# Patient Record
Sex: Male | Born: 1935 | Race: White | Hispanic: No | Smoking: Never smoker
Health system: Southern US, Community
[De-identification: ages and names within clinical notes are randomized; demographics above are authoritative.]

## PROBLEM LIST (undated history)

## (undated) DIAGNOSIS — C719 Malignant neoplasm of brain, unspecified: Secondary | ICD-10-CM

## (undated) DIAGNOSIS — M159 Polyosteoarthritis, unspecified: Secondary | ICD-10-CM

## (undated) HISTORY — PX: BACK SURGERY: SHX140

## (undated) HISTORY — DX: Malignant neoplasm of brain, unspecified: C71.9

## (undated) HISTORY — PX: JOINT REPLACEMENT: SHX530

## (undated) HISTORY — DX: Polyosteoarthritis, unspecified: M15.9

## (undated) HISTORY — PX: CARDIAC CATHETERIZATION: SHX172

---

## 1998-02-28 ENCOUNTER — Encounter: Payer: Self-pay | Admitting: Orthopedic Surgery

## 1998-03-07 ENCOUNTER — Inpatient Hospital Stay (HOSPITAL_COMMUNITY): Admission: RE | Admit: 1998-03-07 | Discharge: 1998-03-12 | Payer: Self-pay | Admitting: Orthopedic Surgery

## 1999-04-01 ENCOUNTER — Ambulatory Visit (HOSPITAL_COMMUNITY): Admission: RE | Admit: 1999-04-01 | Discharge: 1999-04-01 | Payer: Self-pay | Admitting: Cardiology

## 1999-04-08 ENCOUNTER — Ambulatory Visit (HOSPITAL_COMMUNITY): Admission: RE | Admit: 1999-04-08 | Discharge: 1999-04-08 | Payer: Self-pay | Admitting: Cardiology

## 1999-06-16 ENCOUNTER — Ambulatory Visit (HOSPITAL_COMMUNITY): Admission: RE | Admit: 1999-06-16 | Discharge: 1999-06-16 | Payer: Self-pay | Admitting: Cardiology

## 2002-04-25 ENCOUNTER — Ambulatory Visit (HOSPITAL_BASED_OUTPATIENT_CLINIC_OR_DEPARTMENT_OTHER): Admission: RE | Admit: 2002-04-25 | Discharge: 2002-04-25 | Payer: Self-pay | Admitting: Orthopedic Surgery

## 2004-01-10 ENCOUNTER — Encounter: Payer: Self-pay | Admitting: Emergency Medicine

## 2004-01-28 ENCOUNTER — Inpatient Hospital Stay (HOSPITAL_COMMUNITY): Admission: RE | Admit: 2004-01-28 | Discharge: 2004-02-03 | Payer: Self-pay | Admitting: Orthopedic Surgery

## 2004-04-24 ENCOUNTER — Encounter: Admission: RE | Admit: 2004-04-24 | Discharge: 2004-04-24 | Payer: Self-pay | Admitting: Orthopedic Surgery

## 2004-07-24 ENCOUNTER — Encounter: Admission: RE | Admit: 2004-07-24 | Discharge: 2004-07-24 | Payer: Self-pay | Admitting: Internal Medicine

## 2004-12-25 ENCOUNTER — Ambulatory Visit: Payer: Self-pay | Admitting: Internal Medicine

## 2005-01-13 ENCOUNTER — Ambulatory Visit: Payer: Self-pay | Admitting: Internal Medicine

## 2005-01-13 ENCOUNTER — Encounter (INDEPENDENT_AMBULATORY_CARE_PROVIDER_SITE_OTHER): Payer: Self-pay | Admitting: Specialist

## 2006-12-06 ENCOUNTER — Emergency Department: Payer: Self-pay | Admitting: Emergency Medicine

## 2008-11-06 ENCOUNTER — Inpatient Hospital Stay (HOSPITAL_COMMUNITY): Admission: RE | Admit: 2008-11-06 | Discharge: 2008-11-14 | Payer: Self-pay | Admitting: Neurological Surgery

## 2008-11-08 ENCOUNTER — Ambulatory Visit: Payer: Self-pay | Admitting: Physical Medicine & Rehabilitation

## 2008-12-06 ENCOUNTER — Ambulatory Visit (HOSPITAL_COMMUNITY): Admission: RE | Admit: 2008-12-06 | Discharge: 2008-12-06 | Payer: Self-pay | Admitting: Neurological Surgery

## 2008-12-06 ENCOUNTER — Ambulatory Visit: Payer: Self-pay | Admitting: Surgery

## 2008-12-06 ENCOUNTER — Encounter (INDEPENDENT_AMBULATORY_CARE_PROVIDER_SITE_OTHER): Payer: Self-pay | Admitting: Neurological Surgery

## 2009-12-11 ENCOUNTER — Encounter (INDEPENDENT_AMBULATORY_CARE_PROVIDER_SITE_OTHER): Payer: Self-pay | Admitting: *Deleted

## 2010-07-08 NOTE — Letter (Signed)
Summary: Colonoscopy Date Change Letter  [facility]  [address] [place], [place] [address]   Phone: [phone]  Fax: [phone]      December 11, 2009 MRN: [id number]   Mark Kerr [address] [place], [place]  [address]   Dear Mark Kerr,   Previously you were recommended to have a repeat colonoscopy around this time. Your chart was recently reviewed by Dr. Hedwig Morton. Juanda Chance of  Gastroenterology. Follow up colonoscopy is now recommended in August 2013. This revised recommendation is based on current, nationally recognized guidelines for colorectal cancer screening and polyp surveillance. These guidelines are endorsed by the American Cancer Society, [employer] on Colorectal Cancer as well as numerous other major medical organizations.  Please understand that our recommendation assumes that you do not have any new symptoms such as bleeding, a change in bowel habits, anemia, or significant abdominal discomfort. If you do have any concerning GI symptoms or want to discuss the guideline recommendations, please call to arrange an office visit at your earliest convenience. Otherwise we will keep you in our reminder system and contact you 1-2 months prior to the date listed above to schedule your next colonoscopy.  Thank you,  Hedwig Morton. Juanda Chance, M.D.  [facility] [phone]

## 2010-09-15 LAB — BASIC METABOLIC PANEL
BUN: 10 mg/dL (ref 6–23)
CO2: 28 mEq/L (ref 19–32)
Calcium: 7.5 mg/dL — ABNORMAL LOW (ref 8.4–10.5)
Chloride: 108 mEq/L (ref 96–112)
Creatinine, Ser: 0.74 mg/dL (ref 0.4–1.5)
GFR calc Af Amer: >60 mL/min (ref >60)
GFR calc non Af Amer: >60 mL/min (ref >60)
Glucose, Bld: 114 mg/dL — ABNORMAL HIGH (ref 70–99)
Potassium: 3.6 mEq/L (ref 3.5–5.1)
Potassium: 4 mEq/L (ref 3.5–5.1)
Sodium: 140 mEq/L (ref 135–145)

## 2010-09-15 LAB — PREPARE FRESH FROZEN PLASMA

## 2010-09-15 LAB — CBC
HCT: 26.8 % — ABNORMAL LOW (ref 39.0–52.0)
HCT: 29.1 % — ABNORMAL LOW (ref 39.0–52.0)
MCHC: 34.9 g/dL (ref 30.0–36.0)
MCV: 95.2 fL (ref 78.0–100.0)
Platelets: 113 10*3/uL — ABNORMAL LOW (ref 150–400)
RBC: 2.81 MIL/uL — ABNORMAL LOW (ref 4.22–5.81)
RBC: 3.01 MIL/uL — ABNORMAL LOW (ref 4.22–5.81)
RBC: 3.1 MIL/uL — ABNORMAL LOW (ref 4.22–5.81)
RDW: 13.1 % (ref 11.5–15.5)
RDW: 13.2 % (ref 11.5–15.5)
WBC: 8.8 10*3/uL (ref 4.0–10.5)

## 2010-09-16 LAB — CBC
HCT: 41.2 % (ref 39.0–52.0)
MCV: 93.9 fL (ref 78.0–100.0)
RBC: 4.38 MIL/uL (ref 4.22–5.81)
RDW: 13.4 % (ref 11.5–15.5)
WBC: 6.3 10*3/uL (ref 4.0–10.5)

## 2010-09-16 LAB — BASIC METABOLIC PANEL
CO2: 30 mEq/L (ref 19–32)
Calcium: 9.4 mg/dL (ref 8.4–10.5)
GFR calc non Af Amer: >60 mL/min (ref >60)
Potassium: 4.3 mEq/L (ref 3.5–5.1)
Sodium: 141 mEq/L (ref 135–145)

## 2010-09-16 LAB — TYPE AND SCREEN
ABO/RH(D): O POS
Antibody Screen: NEGATIVE

## 2010-09-16 LAB — ABO/RH: ABO/RH(D): O POS

## 2010-10-21 NOTE — Op Note (Signed)
NAMESHLOIME, KEILMAN                 ACCOUNT NO.:  [id number]   MEDICAL RECORD NO.:  [id number]          PATIENT TYPE:  INP   LOCATION:  3110                         FACILITY:  [facility]   PHYSICIAN:  Stefani Dama, M.D.  DATE OF BIRTH:  1930/07/16   DATE OF PROCEDURE:  11/06/2008  DATE OF DISCHARGE:                               OPERATIVE REPORT   PREOPERATIVE DIAGNOSES:  Spondylolisthesis L4-L5 with stenosis L4-L5 and  right lumbar radiculopathy.   POSTOPERATIVE DIAGNOSES:  Spondylolisthesis L4-L5 with stenosis L4-L5  and right lumbar radiculopathy.   PROCEDURES:  Laminectomy L4, posterior lumbar interbody decompression L4-  5, posterior lumbar interbody arthrodesis with PEEK spacers L4-L5,  pedicle screw fixation L4-L5, posterolateral arthrodesis with allograft  and autograft.   SURGEON:  Stefani Dama, MD   FIRST ASSISTANT:  Clydene Fake, MD   ANESTHESIA:  General endotracheal.   INDICATIONS:  [name] is a 77 year old individual who has had  significant back and bilateral lower extremity pain, much worse on the  right side with some weakness in his right leg.  He has had  spondylolisthesis in addition to spondylitic changes elsewhere in the  spine.  He has a high-grade stenosis at the level of L4-L5.  He was seen  last in September 2009 and October 2009 with an MRI that demonstrated  high-grade stenosis and weakness in the right lower extremity, but he  was getting by and he was advised regarding surgical versus surgical  treatment.  He was now taken to the operating room to undergo surgical  decompression and stabilization of L4-L5.   PROCEDURE:  The patient was brought to the operating room, placed on the  table in prone position.  The back was prepped with alcohol and DuraPrep  and draped in a sterile fashion.  Midline incision was created and  carried down to the lumbodorsal fascia.  Fascia was opened on either  side of midline to expose the interlaminar  spaces.  L4 and L5 were  positively identified.  The dissection was carried out over the facet  joints and there was noted to be particularly severe hypertrophy of the  L4-L5 facet joint and laminotomies were created removing the inferior  margin lamina and including the entirety of the inferior facet of L4.  The common dural tube was then exposed and the dissection was carried  out carefully to expose the common dural tube up above the L4 nerve root  superiorly.  The foramen of the L4 nerve root particularly on the right  side was noted to be severely stenotic.  This was carefully dissected  with a 2-mm Kerrison punch being used to dissect the L5 nerve root  inferiorly with severe lateral recess stenosis noted for that nerve root  in the fourth nerve roots superiorly which was being abutted by the  superior articular process of L5 vertebra in the region of the foramen.  Once this was decompressed and hemostasis was achieved, the disk space  was identified and it was entered with a 15 blade combination of  curettes and rongeurs and spreaders  were used to dissect the disk space  and perform as complete diskectomy.  Once the dilator could be placed in  the space, the opposite side that is the left side was decompressed with  care being taken to protect the L4 nerve root and do a thorough  decompression of the L4 nerve root into the extraforaminal space.  L5  nerve root was similarly decompressed from severe overgrown laminar and  lateral recess stenosis.  Once these areas were secured, diskectomy was  performed on the left side.  Complete diskectomy was performed so that  ultimately a 10-mm PEEK spacer could be placed into the interspace.  Once the endplates were appropriately decorticated, combination of  autograft with some infuse strips was placed into the disk place and  autograft bearing PEEK spacers were packed into the interspaces and the  retractor was withdrawn.  Attention was then  turned to pedicle screw  placement using fluoroscopic guidance.  On the left side a 6.5 x 45 mm  pedicle screw was placed into L5 and on the right side a 6.5 x 40 mm  screw was placed at L5.  At L4, two 6.5 x 40 mm screws were placed being  careful to make sure that there was no lateral cutout.  Once the screws  were placed, a 30-mm rod was used to pack the two screws.  It was noted  that there was on a postoperative radiograph partial reduction of the  severe grade 1 to 2 spondylolisthesis that was part of the stenosis.  Once the hardware was placed, it was locked into position with a torque  wrench.  Lateral gutters which had previously been packed off were  decorticated, then laid with autograft in addition to some chronOS  strips.  These were packed tightly in the lateral gutters.  Once the  retractors were removed, hemostasis was achieved from the soft tissues  and the muscular tissues.  Lumbodorsal fascia was then reapproximated  with #1 Vicryl in the fascia and 2-0 Vicryl was used in the subcutaneous  tissues and 3-0 Vicryl subcuticularly.  Steri-Strips were placed on the  skin.  The patient tolerated the procedure well and was returned to  recovery in stable condition.      Stefani Dama, M.D.  Electronically Signed     HJE/MEDQ  D:  11/06/2008  T:  11/07/2008  Job:  [id number]

## 2010-10-21 NOTE — Discharge Summary (Signed)
Mark Kerr, Mark Kerr                 ACCOUNT NO.:  [id number]   MEDICAL RECORD NO.:  [id number]          PATIENT TYPE:  INP   LOCATION:  5037                         FACILITY:  [facility]   PHYSICIAN:  Stefani Dama, M.D.  DATE OF BIRTH:  1930/12/21   DATE OF ADMISSION:  11/06/2008  DATE OF DISCHARGE:  11/14/2008                               DISCHARGE SUMMARY   ADMITTING DIAGNOSIS:  Spondylolisthesis L4-5 with spinal stenosis L4-5  and right lower extremity lumbar radiculopathy.   DISCHARGE DIAGNOSIS:  Spondylolisthesis L4-5 with spinal stenosis L4-5  and right lower extremity lumbar radiculopathy.   OPERATIONS AND PROCEDURES:  Laminectomy L4, posterior lumbar interbody  decompression L4-5, posterior lumbar interbody arthrodesis with peak  spacers L4-5, pedicle screw fixation L4-5, posterolateral arthrodesis  with allograft and autograft by Dr. Danielle Dess.   ASSISTANT:  Dr. Phoebe Perch.   BRIEF HISTORY AND HOSPITAL COURSE:  Mr. Minion is a 77 year old who has  had significant back and bilateral lower extremity pain worse on the  right side with right leg weakness.  He has had a spondylolisthesis in  addition to spondylosis changes, high-grade stenosis at the level of L4-  L5.  Seen in September 2009 and October 2009.  MRI demonstrated high-  grade stenosis, weakness regular extremity.  He did not respond to  conservative measures, and he ha snow elected to proceed with surgical  intervention to decompress and stabilize the L4-5 segment.  On November 06, 2008, the patient underwent surgical intervention, posterior spinal  fusion L4-5 with interbody fusion spacer.  He tolerated the procedure  well, stabilized in recovery room, placed in the neurosurgical ICU.  Postoperatively, he had some hypotension which was asymptomatic, treated  with IV fluid boluses, and he did receive a unit of Plasmanate  postoperatively.  He had hemoglobin of 9.1, hematocrit of 26.8.  Prior  to that, it was 9.8, hematocrit  28.6, and postoperatively immediately  was 10.2 and hematocrit 29.1 and preoperatively 14.4 with hematocrit of  41.2.  After his blood pressure stabilized and after he received  Plasmanate, his blood pressure became stable.  He started working with  physical therapy and occupational therapy.  He was making slow progress.  He was transferred out of the neurosurgical ICU to a regular hospital  bed, was weaned off his PCA pump to p.o. pain medications, continued to  make slow progress.  Due to his age, difficulty with ambulation and the  fact that his wife was also hospitalized at the same time, and he did  not have anybody at home for providing care, it was felt that he would  benefit from a temporary skilled nursing facility stay.  He continued to  work with physical therapy and occupational therapy during his  hospitalization.  He was eating well, voiding well at the time of  discharge, and he was ready for discharge on Wednesday, November 14, 2008.   DISCHARGE CONDITION:  Stable, improved.   DISCHARGE INSTRUCTIONS:  Discharge to skilled nursing facility to follow  up with Dr. Danielle Dess in 3 weeks for radiographs and clinical check.  Prescription  for Percocet 1 to 2 p.o. q.4-6 h. p.r.n. pain, Valium 5 mg  1 p.o. q.8-12 h. p.r.n. muscle spasm.  Continue with physical therapy,  occupational therapy on progressive ambulation and safe ambulation.  He  will need stair training because he has 13 steps at home to mobilize  about his own home, and he will need generalized conditioning and  strengthening.  He will continue with back precautions.  He is wearing  an Aspen quick draw brace which he should continue.  He should also  continue on his home medications which include Senokot, Lovaza,  multivitamins, Neurontin 300 mg t.i.d., Colace 100 mg b.i.d., Voltaren  75 mg b.i.d. which should  take sparingly due to his recent spinal fusion and vitamin D 2000 units  p.o. daily.  Zofran is p.r.n. nausea 2 to  4 mg p.o. q.4-6 h. p.r.n.  nausea.  Should continue on his home medications and contact our office  prior to follow up if there are any questions or concerns.  Discharged  to skilled nursing facility via ambulance November 14, 2008.      Aura Fey Bobbe Medico.      Stefani Dama, M.D.  Electronically Signed    SCI/MEDQ  D:  11/14/2008  T:  11/14/2008  Job:  [id number]

## 2010-10-24 NOTE — Consult Note (Signed)
NAME:  Mark Kerr, Mark Kerr                           ACCOUNT NO.:  [id number]   MEDICAL RECORD NO.:  [id number]                   PATIENT TYPE:  INP   LOCATION:  0483                                 FACILITY:  [facility]   PHYSICIAN:  Kari Baars, M.D.               DATE OF BIRTH:  02-11-1931   DATE OF CONSULTATION:  01/30/2004  DATE OF DISCHARGE:                                   CONSULTATION   REFERRING PHYSICIAN:  Marlowe Kays, M.D.   PRIMARY CARE PHYSICIAN:  Mark A. Perini, M.D.   REASON FOR CONSULTATION:  Postoperative fever.   HISTORY OF PRESENT ILLNESS:  Mark Kerr is a 77 year old white male with a  history of osteoarthritis who was admitted on January 28, 2004, for elective  right total knee replacement by Dr. Simonne Come.  This procedure was  uncomplicated until postoperative day #1, when he developed a low-grade  temperature of 101.1.  This morning, he had a temperature of 102.3.  We have  been asked to see him for evaluation of his postoperative fever.  The  patient does report mild shortness of breath which is improved after  nebulizer administration this morning.  He has had minimal cough without  purulent sputum production.  He did ambulate this morning and did not have  any significant dyspnea on exertion.  He does have a Foley catheter in  place.  He denies any other focal symptoms, including abdominal pain,  diarrhea, or skin rash.  Of note, he has had significant oozing from his  incision with a decrease in his hemoglobin from 12 to 8.9.  This has not  required transfusion.  He is relatively asymptomatic with regards to this.   REVIEW OF SYSTEMS:  All systems are extensively reviewed and are negative  except as in the HPI.  His pain is controlled on PCA.   PAST MEDICAL HISTORY:  1. Non-obstructive coronary artery disease with a negative Cardiolite last     week.  He had a cardiac catheterization in 2000.  2. Osteoarthritis, status post left total knee replacement  (1999), and right     total knee arthroplasty (January 28, 2004).  3. Status post right Achilles repair (2003).  4. Status post herniorrhaphy (1998).  5. Status post tonsillectomy (1941).   CURRENT MEDICATIONS:  1. Ancef 1 g q.8h. (day #3 of #3).  2. Colace daily.  3. Lovenox 30 mg b.i.d.  4. Coumadin 7.5 mg daily per protocol.  5. Robaxin 500 mg q.8h.  6. Trinsicon q.8h.  7. Albuterol p.r.n.  8. Morphine PCA.   ALLERGIES:  No known drug allergies.   FAMILY HISTORY:  His mother died of a MI at age 15.  She had significant  alcohol history.  His father also had an alcohol history and drowned at the  age of 91.  His sister has hypertension.   SOCIAL HISTORY:  He is married.  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  He denies any tobacco, alcohol, or drug  use.   PHYSICAL EXAMINATION:  VITAL SIGNS:  Current temperature 98.2, temperature  max 102.3 at 6:30 a.m. this morning.  Pulse 94, blood pressure 97/55,  respirations 20, oxygen saturation most recently 93% on 2 L.  He is on room  air currently.  His oxygen saturations were as low as 84% on room air  yesterday.  GENERAL:  Healthy-appearing white male in no acute distress.  Alert and  oriented x4.  HEENT:  Oropharynx is moist without erythema.  Extraocular movements were  intact.  NECK:  Supple without lymphadenopathy, JVD, or carotid bruits.  HEART:  Regular rate and rhythm without murmurs.  LUNGS:  Clear to auscultation bilaterally.  ABDOMEN:  Soft, nontender, nondistended, normoactive bowel sounds.  EXTREMITIES:  His right knee is immobilized.  The dressing is clean.  There  is no significant erythema surrounding the dressing.  He has trace edema in  the right leg.  Pulses are intact.  GENITOURINARY:  He does have a Foley in place.  SKIN:  No rash.   LABORATORY DATA:  His INR was 1.4 this morning.  Hemoglobin was 8.9, which  has decreased from 12 on January 28, 2004.  Complete metabolic  panel was  normal on January 21, 2004, with the exception of a mildly elevated AST of  43.   Chest x-ray was personally reviewed and shows no acute cardiopulmonary  disease.   ASSESSMENT AND PLAN:  1. Postoperative fever status post right knee replacement.  His chest x-ray     is unrevealing.  Potential sources to include atelectasis, urinary tract     infection, blood stream infection, pulmonary embolism, and surgical site     complications, including a hematoma.  We will obtain an urinalysis, urine     cultures, blood cultures.  We will hold off on antibiotic administration     unless the source is identified or he becomes unstable.  Agree with     incentive spirometry.  Recommend discontinuation of Foley catheter when     stable.  2. Hypoxia.  This is currently resolved and we will monitor his oxygen     saturations.  His transient hypoxia may be explained due to sedation plus     atelectasis.  However, if it is recurrent or persists, I feel that we do     need to rule out pulmonary embolism.  There is no evidence of pneumonia     on his chest x-ray.  Continue incentive spirometry and albuterol as     needed.  3. Anemia.  This is likely due to his surgery.  Consider transfusion if he     is hypoxic, hypotensive, or symptomatic, or if his hemoglobin is less     than 8.  We will obtain a CBC in the morning and monitor his hemoglobin     and stools with anticoagulation.  4. Deep venous thrombosis prophylaxis.  Continue Lovenox until his INR is     therapeutic.  5. We will follow with you.  Dr. Waynard Edwards will see the patient in the morning.     Thank you for involving [place] in the care of this very nice gentleman.  Kari Baars, M.D.    WS/MEDQ  D:  01/30/2004  T:  01/30/2004  Job:  [id number]   cc:   Loraine Leriche A. Waynard Edwards, M.D.  [address]  [place]  [place] [address]  Fax: [phone]   Peter M. Swaziland, M.D. [address]   [place], [place] [address]  Fax: [phone]

## 2010-10-24 NOTE — H&P (Signed)
NAME:  Mark Kerr, Mark Kerr                           ACCOUNT NO.:  [id number]   MEDICAL RECORD NO.:  [id number]                   PATIENT TYPE:  INP   LOCATION:  NA                                   FACILITY:  [facility]   PHYSICIAN:  Marlowe Kays, M.D.               DATE OF BIRTH:  25-May-1931   DATE OF ADMISSION:  01/28/2004  DATE OF DISCHARGE:                                HISTORY & PHYSICAL   CHIEF COMPLAINT:  Pain in my right knee.   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  The patient is having more and more significant  pain into his right knee.  He is a very active gentleman and is finding that  his right knee pain is markedly interfering with his day-to-day activities.  X-rays have shown advanced arthritic changes in the patellofemoral joint and  bone-on-bone deformity medially in the joint.  He has developed a varus  deformity to this knee and has lost full extension.  Due to these positive  findings and the fact that this patient enjoys an active high quality of  life, it is felt that he would benefit from surgical intervention with total  knee replacement arthroplasty to the right knee.  The patient has received a  Cardiolite study by Peter M. Swaziland, M.D., ordered by Redge Gainer. Perini, M.D.,  and it is perfectly normal.   PAST MEDICAL HISTORY:  1. This gentleman underwent left total knee replacement arthroplasty in     1999.  2. He had repair of his right Achilles' tendon in 2003.  3. Herniorrhaphy in 1998.  4. Tonsillectomy in 1941.  5. He had a heart catheterization in the late 1990s or early 2000s.   CURRENT MEDICATIONS:  1. Volteran 75 mg b.i.d. (will stop prior to surgery).  2. Aspirin 81 mg two a week (will stop prior to surgery).  3. Darvocet-N 100 for pain.   INTERNAL MEDICINE PHYSICIAN:  Loraine Leriche A. Perini, M.D.   CARDIOLOGIST:   Peter M. [place], M.D.   ALLERGIES:  No known drug allergies.   SOCIAL HISTORY:  The patient is married.  He is a Teacher, early years/pre at [employer].  He  has no intake of tobacco or alcohol products.  He has three children.  His  caregiver after surgery will be home health with Genevieve Norlander and his wife.   FAMILY HISTORY:  Noncontributory.   REVIEW OF SYSTEMS:  CENTRAL NERVOUS SYSTEM:  No seizure disorder, paralysis,  numbness or double vision.  RESPIRATORY:  No productive cough, no hemoptysis  and no shortness of breath.  CARDIOVASCULAR:  No chest pain, no angina and  no orthopnea.  GASTROINTESTINAL:  No nausea, vomiting, melena or bloody  stool.  GENITOURINARY:  No discharge, dysuria or hematuria.  MUSCULOSKELETAL:  Primarily as in the  present illness with his right knee.   PHYSICAL EXAMINATION:  GENERAL APPEARANCE:  An alert, cooperative, thin,  wiry, 77 year old, white male who is awake, alert, oriented and animated.  VITAL SIGNS:  Blood pressure 140/77, pulse 80 and regular, respirations 12  and unlabored.  HEENT:  Normocephalic.  PERRLA.  EOM intact.  The oropharynx is clear.  CHEST:  Clear to auscultation.  No rhonchi.  No rales.  HEART:  Regular rate and rhythm.  No murmurs are heard.  ABDOMEN:  Soft and nontender.  The liver and spleen are not felt.  GENITALIA:  Not done.  Not pertinent to present illness.  RECTAL:  Not done.  Not pertinent to present illness.  EXTREMITIES:  Right knee as in present illness above.  The left knee shows  excellent range of motion with an anterior scar consistent with total knee  replacement arthroplasty.   ADMISSION DIAGNOSIS:  Severe osteoarthritis of the right knee.   PLAN:  The patient will undergo left total knee replacement arthroplasty.  Should he have any medical problems, we will certainly call Mark A. Perini,  M.D., to follow along with [place] during this patient's hospitalization.  Should  he have, which we strongly doubt, any cardiology situations, we  will call  Dr. [place].     Dooley L. Cherlynn June.                 Marlowe Kays, M.D.    DLU/MEDQ  D:  01/17/2004  T:  01/17/2004  Job:  [id number]   cc:   Loraine Leriche A. Waynard Edwards, M.D.  [address]  [place]  [place] [address]  Fax: [phone]   Peter M. [place], M.D.  [address]  [place], [place] [address]  Fax: [phone]

## 2010-10-24 NOTE — Discharge Summary (Signed)
NAME:  Mark Kerr, Mark Kerr                           ACCOUNT NO.:  [id number]   MEDICAL RECORD NO.:  [id number]                   PATIENT TYPE:  INP   LOCATION:  0483                                 FACILITY:  [facility]   PHYSICIAN:  Marlowe Kays, M.D.               DATE OF BIRTH:  [birth year]   DATE OF ADMISSION:  01/28/2004  DATE OF DISCHARGE:  02/03/2004                                 DISCHARGE SUMMARY   ADMITTING DIAGNOSES:  Osteoarthritis right knee.   DISCHARGE DIAGNOSES:  Osteoarthritis right knee.   OPERATION:  Total knee replacement right knee.   CONSULTS:  Dr. Rodrigo Ran, medical service   BRIEF HISTORY:  This is a 77 year old gentleman with end-stage  osteoarthritis of his knee who failed conservative management and was  scheduled for total knee arthroplasty of the right knee.  He is admitted  after discussion of risks, benefits, and after care for total knee  arthroplasty.   LABORATORIES:  Admission CBC within normal limits.  Repeat values showed the  hemoglobin and hematocrit to drop to a low of 7.5 and 22.4.  On the 25th he  was transfused and by discharge was at 11.1 and 32.5.  PT/PTT within normal  limits on admission and at discharge showed a PT of 19.2 with an INR of 2.0.  Admission CMET within normal limits with the exception of a slightly  elevated AST.  Urinalysis on admission within normal limits.  Blood cultures  showed no growth.  Urinalysis on the 25th showed a small amount of leukocyte  esterase with rare wbc's and rbc's.  Urine culture showed no growth.   HOSPITAL COURSE:  Patient tolerated the operative procedure well.  First  postoperative day he was awake, alert, and oriented.  Calf was soft.  Neurovascularly he was intact.  Later on the first postoperative day evening  it was noted that his O2 saturations were decreasing and he was drowsy.  Also, his temperature was to 101.1.  His lungs were clear.  His PCA was  dropped down to a low dose and after  encouragement his O2 saturations were  up to 99 on 2 L.  He was kept on low dose and incentive spirometry was  increased.  He was noted to have no calf tenderness, no cords, negative  Homan's sign.  CPM was held for that evening.  Second postoperative day he  had a fever secondary to atelectasis.  Chest x-ray was ordered and Dr.  Waynard Edwards was called to evaluate the patient medically.  Third postoperative  day he was awake, alert, and oriented, tolerating CPM well.  He was noted to  have a Steri-Strip blister.  This was treated with Tegaderm.  Patient was  noted to be anemic and was transfused 2 units of packed cells without  difficulty.  Third postoperative day he continued to do well.  He felt  better after his transfusion.  Fourth postoperative day he continued to make  progress.  He was doing well in physical therapy, but did not feel he was  ready to be discharged at this time.  He continued with his CPM and wound  care.  His fever resolved and his anemia was improved after transfusion.  On  the sixth postoperative day, feeling better, patient wanting to go home.  He  was discharged home with stable hemoglobin/hematocrit 11.1 and 32.5, PT 19.5  with an INR of 2.0, and afebrile.   CONDITION ON DISCHARGE:  Improved.   DISCHARGE MEDICATIONS:  1.  Tylox.  2.  Coumadin.  3.  Robaxin.   FOLLOWUP:  Follow up in the office in 7-10 days.   DISCHARGE INSTRUCTIONS:  He is going to have home physical therapy, home  health.  He will be on Coumadin a total of three weeks postoperatively and  will call the office if any problems or questions arise.     Jaquelyn Bitter. Chabon, P.A.                   Marlowe Kays, M.D.    SJC/MEDQ  D:  02/18/2004  T:  02/18/2004  Job:  [id number]

## 2010-10-24 NOTE — Cardiovascular Report (Signed)
[place]. [facility]  Patient:    Mark Kerr                         MRN: [id number] Proc. Date: 04/08/99 Adm. Date:  [id number] Attending:  [place], Peter Manning CC:         Rana Snare. Judie Grieve, M.D.             Cardiac Catheterization Laboratory                        Cardiac Catheterization  INDICATIONS FOR PROCEDURE:  The patient is a 77 year old white male who had an episode of severe substernal chest pain one month ago.  Subsequent exercise stress test suggested anterior ischemia.  ACCESS:  Via the right femoral artery using the standard Seldinger technique.  EQUIPMENT:  6 French 4 cm right and left Judkins catheter, 6 French pigtail catheter, 6 French arterial sheath.  MEDICATIONS:  Local anesthesia with 1% Xylocaine.  CONTRAST:  Omnipaque 180 cc.  HEMODYNAMIC DATA:  Aortic pressure is 115/67 with a mean of 86.  Left ventricular pressure is 125 with an EDP of 18.  There is no aortic valve gradient by pullback.  ANGIOGRAPHIC DATA:  Left coronary artery:  The left coronary artery arises and distributes normally.  Left main:  The left main coronary artery is mildly calcified without significant obstructive disease.  Left anterior descending:  The left anterior descending artery is also mildly calcified.  It has minor wall irregularities, less than 10-20%.  There is mild systolic compression in the mid a LAD, less than 30%.  The left circumflex coronary artery also has minor wall irregularities, less than 10%.  There is a very tiny branch of a obtuse marginal vessel which appears to e occluded distally.  Right coronary artery:  The right coronary artery arises and distributes normally. It is a large dominant vessel.  There is a 30% narrowing in the ostium of the right coronary artery and a 30% narrowing in the mid right coronary artery. The posterior descending artery has a focal 60-70% stenosis in the proximal vessel.  LEFT VENTRICULAR  ANGIOGRAPHY:  The left ventricular angiography demonstrates normal left ventricular size and contractility with normal systolic function. Ejection fraction is estimated at 55%.  There is no mitral regurgitation or prolapse. The aortic valve appears normal.  FINAL INTERPRETATION: 1. Modest single-vessel obstruction atherosclerotic coronary artery disease. 2. Normal left ventricular function.  PLAN:  Would recommend medical therapy. DD:  04/08/99 TD:  04/08/99 Job: [id number]

## 2010-10-24 NOTE — Op Note (Signed)
NAME:  Mark Kerr, Mark Kerr                           ACCOUNT NO.:  [id number]   MEDICAL RECORD NO.:  [id number]                   PATIENT TYPE:  INP   LOCATION:  0001                                 FACILITY:  [facility]   PHYSICIAN:  Marlowe Kays, M.D.               DATE OF BIRTH:  [birth year]   DATE OF PROCEDURE:  01/28/2004  DATE OF DISCHARGE:                                 OPERATIVE REPORT   PREOPERATIVE DIAGNOSIS:  Osteoarthritis, right knee.   POSTOPERATIVE DIAGNOSIS:  Osteoarthritis, right knee.   OPERATION:  Osteonics total knee replacement, right.   SURGEON:  Dr. Fayrene Fearing [place]   ASSISTANT:  Dr. Worthy Rancher   ANESTHESIA:  General.   PATHOLOGY AND JUSTIFICATION FOR PROCEDURE:  He has had a successful  replacement on the left, has bone-on-bone abutment medially with flexion  contracture on the right.   PROCEDURE:  Prophylactic antibiotics, satisfactory general anesthesia,  pneumatic tourniquet, Surefoot, lateral hip stabilizer.  The right leg was  prepped with DuraPrep from tourniquet to ankle and draped in a sterile  field.  Ioban employed.  Vertical midline incision down to the patellar neck  with median parapatellar incision to open the joint.  __________ and medial  collateral ligament were dissected off the tibia back posteriorly.  The  patellar mechanism was freed up, patella everted, and the knee flexed.  Osteophytes were removed from around the patella and the femur.  ACL and PCL  were sacrificed.  The anterior portions of the two menisci were removed.  I  made a 5/16 inch drill hole in the distal femur and used the axis line set  for 5-degree valgus cut to the right knee.  We took 12 mm off the distal  femur because of flexion contracture.  Then incised the distal femur at a #9  using the cradle guide and made the indicator holes for the distal femoral  cutting jig which was applied and anterior and posterior cuts and posterior  anterior chamferings were then made.   I then made a leveling cut on the  tibia and sized it at a #9 using the 9 baseplate, made the initial drill  hole into the tibial canal, followed by a step-cut drill and the canal  finder.  Then used the axis aligner, and we used it set at zero degrees, and  I made a 4 mm cut based off the depressed medial tibial plateau.  I then  placed the femoral guide for creating both the patellar groove and also the  cutting and impaction for the stabilizing post.  Before doing this latter, I  used a microsaw to free up bone in the intercondylar notch area.  We then  brought the knee in extension.  I used the 10 mm recess cutting jig to make  a 10 mm recess cut and filed this with a guide for creating the three  fixation holes.  I then placed a trial 26 prosthesis and trimmed bone from  around the perimeter until the prosthesis was pried to the surrounding bone.  I then went through a trial reduction and found that a 12 mm spacer was the  best fit.  We used the external guide, split the bimalleolar distance to  mark scribe lines using the tibial baseplate and based on this, we then  attached the tibial baseplate to the tibia with three stabilizing pins and  used the tripod apparatus to ream up to a 9 cemented.  We then waterpicked  the knee while the methyl methacrylate was being mixed.  I  placed Gelfoam  in the femoral and tibial holes.  When the glue was of the proper  consistency and the bone dry, I individually applied three components, first  using glue gun and applying glue to the tibia, impacting the component and  removing excess methyl methacrylate, then moving to the femur and with the  knee in extension with 10 mm spacer, glued in the patella which we held with  the patella holding clamp.  When the methacrylate hardened, we removed bits  of methacrylate from around the component and went through a trial reduction  with a 12 mm spacer which was found to be ideal.  Accordingly, after   irrigating the wound well and checking once again for bits of methacrylate,  we placed the 12 mm spacer, posterior stabilized, reduced the knee and found  it to be nice and stable with excellent motion.  The patella did not require  any lateral release.  Hemovac was placed and the knee closed with  interrupted #1 Vicryl in two layers in the quadriceps and two layers  distally with one in the synovium and one in the capsule.  Subcutaneous  tissue was closed with a combination of 0 and 2-0 Vicryl and skin with  staples.  Betadine, Adaptic dry sterile dressing were applied.  Tourniquet  was released.  He tolerated the procedure well and was taken to the recovery  room in satisfactory condition with no known complications, no blood loss,  no blood replacement.                                               Marlowe Kays, M.D.    JA/MEDQ  D:  01/28/2004  T:  01/28/2004  Job:  [id number]

## 2010-10-24 NOTE — Op Note (Signed)
NAME:  Mark Kerr, Mark Kerr                           ACCOUNT NO.:  [id number]   MEDICAL RECORD NO.:  [id number]                   PATIENT TYPE:  AMB   LOCATION:  DSC                                  FACILITY:  [facility]   PHYSICIAN:  Marlowe Kays, M.D.               DATE OF BIRTH:  1931/04/02   DATE OF PROCEDURE:  04/25/2002  DATE OF DISCHARGE:                                 OPERATIVE REPORT   PREOPERATIVE DIAGNOSES:  Complete rupture of left Achilles' tendon.   POSTOPERATIVE DIAGNOSES:  Complete rupture of left Achilles' tendon.   OPERATION:  Repair of left Achilles' tendon rupture.   ASSISTANT:  Clarene Reamer, PA-C   ANESTHESIA:  General   INDICATIONS FOR PROCEDURE:  He first injured his ankle on September 24 and  reinjured on 04/10/2002.  I saw him in the office on 04/17/2002 at which time  he clearly had a complete defect in his Achilles' tendon several  fingerbreadths above its attachment, and this did not completely close down  with plantar flexion of the ankle; and I felt that repair was indicated and  he indicated he would like to have repair rather than going with nonsurgical  treatment.  He has had a total knee replacement on the left.   PROCEDURE:  Prophylactic antibiotics, satisfactory general anesthesia,  pneumatic tourniquet applied and leg Esmarched out in the supine position.  He was then placed in the prone position on rolls, and his left leg from  just distal to the knee to the toes was prepped with Duraprep and draped in  a sterile field.  A gentle lazy S-type incision was made with the curve just  above the rupture site and coming down over the lateral side of the  insertion of the Achilles' tendon but we did not cross the ankle line, and  through the subcutaneous tissues and the complete rupture was identifiable.  He had a very thickened tendon sheath proximal. This was opened, wound  thoroughly irrigated and adhesions freed up and the exact anatomy of the  tear identified.  It came together quite well with the ankle plantar flexed.  I started my repair using a Castro stitch of #1 Ethibond which stabilized  the two ends of the tendon and I supplemented this with some #1 Vicryl.  Following this, I constructed two flaps of tendinous tissue starting first  about 3 cm proximal to the rupture with each flap being about a centimeter  wide and 7 cm long.  I was then able to reflect these distally, rotated them  180 degrees so the rough side was adjacent to the tendon and the smooth side  was external.  These two flaps were then sutured down distally with  interrupted #1 Vicryl and following this the two sides were sutured to each  other and through the tendon, and then finally laterally on either side  sutured to the  remaining tendons.  This gave a nice thick tendon with the  repair site buttressed.  I then closed the donor sites with running #1  Vicryl.  The wound was irrigated well with sterile saline. The tendon sheath  proximally was reapproximated with interrupted 0 Vicryl, subcutaneous  tissues with 2-0 Vicryl, and the skin with combination of running and  interrupted 3-0 nylon mattress sutures.  Betadine Adaptic sterile dressings  were applied with a well-padded short-leg case. The  tourniquet was  released. He tolerated the procedure, and was returned to the recovery room  in satisfactory condition with no complications.                                               Marlowe Kays, M.D.   JA/MEDQ  D:  04/25/2002  T:  04/25/2002  Job:  [id number]

## 2011-03-09 DIAGNOSIS — C719 Malignant neoplasm of brain, unspecified: Secondary | ICD-10-CM

## 2011-03-09 HISTORY — DX: Malignant neoplasm of brain, unspecified: C71.9

## 2011-03-22 ENCOUNTER — Emergency Department (HOSPITAL_COMMUNITY): Payer: Medicare Other

## 2011-03-22 ENCOUNTER — Inpatient Hospital Stay (HOSPITAL_COMMUNITY)
Admission: EM | Admit: 2011-03-22 | Discharge: 2011-03-25 | DRG: 054 | Disposition: A | Discharge status: Home / Self Care | Payer: Medicare Other | Attending: Neurological Surgery | Admitting: Neurological Surgery

## 2011-03-22 DIAGNOSIS — R4701 Aphasia: Secondary | ICD-10-CM | POA: Diagnosis present

## 2011-03-22 DIAGNOSIS — Z96659 Presence of unspecified artificial knee joint: Secondary | ICD-10-CM

## 2011-03-22 DIAGNOSIS — C711 Malignant neoplasm of frontal lobe: Principal | ICD-10-CM | POA: Diagnosis present

## 2011-03-22 DIAGNOSIS — G936 Cerebral edema: Secondary | ICD-10-CM | POA: Diagnosis present

## 2011-03-22 LAB — DIFFERENTIAL
Basophils Absolute: 0 10*3/uL (ref 0.0–0.1)
Basophils Relative: 0 % (ref 0–1)
Lymphs Abs: 2.4 10*3/uL (ref 0.7–4.0)
Neutrophils Relative %: 65 % (ref 43–77)

## 2011-03-22 LAB — COMPREHENSIVE METABOLIC PANEL
Alkaline Phosphatase: 103 U/L (ref 39–117)
BUN: 17 mg/dL (ref 6–23)
CO2: 25 mEq/L (ref 19–32)
Chloride: 103 mEq/L (ref 96–112)
Creatinine, Ser: 0.65 mg/dL (ref 0.50–1.35)
GFR calc Af Amer: >90 mL/min (ref >90)
Sodium: 138 mEq/L (ref 135–145)
Total Protein: 7 g/dL (ref 6.0–8.3)

## 2011-03-22 LAB — PROTIME-INR
INR: 1 (ref 0.00–1.49)
Prothrombin Time: 13.4 seconds (ref 11.6–15.2)

## 2011-03-22 LAB — CBC
HCT: 41.7 % (ref 39.0–52.0)
Hemoglobin: 14.7 g/dL (ref 13.0–17.0)
MCHC: 35.3 g/dL (ref 30.0–36.0)
Platelets: 187 10*3/uL (ref 150–400)
RDW: 13.1 % (ref 11.5–15.5)

## 2011-03-22 MED ORDER — GADOBENATE DIMEGLUMINE 529 MG/ML IV SOLN
15.0000 mL | Freq: Once | INTRAVENOUS | Status: AC | PRN
  Administered 2011-03-22: 15 mL via INTRAVENOUS

## 2011-03-23 ENCOUNTER — Inpatient Hospital Stay (HOSPITAL_COMMUNITY): Payer: Medicare Other

## 2011-03-27 NOTE — H&P (Signed)
NAMEGAWAIN, CROMBIE NO.:  [id number]  MEDICAL RECORD NO.:  [id number]  LOCATION:  4580                         FACILITY:  [facility]  PHYSICIAN:  Eustace Moore, MD     DATE OF BIRTH:  Nov 25, 1930  DATE OF ADMISSION:  03/22/2011 DATE OF DISCHARGE:                             HISTORY & PHYSICAL   ADMITTING DIAGNOSIS:  Brain mass.  HISTORY OF PRESENT ILLNESS:  Mr. Mcnatt is a 77 year old gentleman who presents to the emergency department with a 2-week history of difficulty "finishing my sentences." He has headaches only when he coughs, no visual changes.  No numbness or weakness in the extremities.  His family states that about 6 weeks ago he was getting to the car and felt something pop in his hip and did have some trouble lifting the leg after that, but he reported to his family that has improved.  CT scan done in the emergency department showed a brain mass and Neurosurgical evaluation was requested.  PAST SURGERY HISTORY: 1. Posterior lumbar interbody fusion L4-5 by Dr. Kristeen Miss in 2010. 2. Achilles tendon repair. 3. Bilateral knee replacements.  MEDICATIONS:  Voltaren.  ALLERGIES:  No known drug allergies.  SOCIAL HISTORY:  Denies use of tobacco or alcohol products.  REVIEW OF SYSTEMS:  Otherwise, negative.  PHYSICAL EXAMINATION:  VITAL SIGNS:  Pulse 75, respirations 16.  He is afebrile. GENERAL:  Pleasant and cooperative male lying in stretcher. HEENT:  Normocephalic, atraumatic.  Extraocular movements are intact. NECK:  Supple. HEART:  Regular rate and rhythm. EXTREMITIES:  No obvious deformities. NEUROLOGIC:  He is awake and alert, pleasant, interactive.  He has good naming, good repetition, good comprehension.  He has difficulty with fluency.  He has good attention span.  His memory appears to be good. His extraocular movements are intact.  His pupils are equal and reactive.  His face looks symmetric.  The tongue protrudes in the midline.   He can puff out his cheeks.  He has good shoulder shrug.  He has no pronator drift.  He seems to have good strength throughout his upper and lower extremities.  Gait is not tested.  IMAGING STUDIES:  CT scan of the brain I have reviewed as well as report there is a mass in the left frontal region which appears to cross over the superior part of the corpus callosum.  There is some edema, some mass effect, and a mild amount of shift.  ASSESSMENT AND PLAN:  This is a 77 year old with a left frontal brain mass.  He has no history of cancer but this still could be a metastatic lesion.  It could also be a primary brain lesion such as a GBM or lymphoma.  Common things being common this is likely a GBM, however, it looks a little organized on 2 of the cuts and therefore, I would like to go ahead and get an MRI of the brain with and without contrast to get a better evaluation of this in all 3 planes.  I have explained to the family that he does have a brain mass.  If it looks like a primary tumor by MRI, then I doubt  he will be a surgical candidate because it will then be a "butterfly" GBM and will likely require simple palliative treatment only such as radiation therapy plus or minus oral chemotherapy.  However, if it looks more like some other lesion then may be a candidate for biopsy or even section depending upon the appearance and location of the lesion.  We will go ahead and put him on Decadron as this does not look like an infection of any type.  I do not want to put him on an anti-epileptic medication at this point.  We will get him admitted and make further treatment plans once the MRI is done.     Eustace Moore, MD     DSJ/MEDQ  D:  03/22/2011  T:  03/23/2011  Job:  [id number]  Electronically Signed by Sherley Bounds MD on 03/27/2011 10:50:51 AM

## 2011-04-06 ENCOUNTER — Encounter: Payer: Medicare Other | Admitting: Oncology

## 2011-04-07 ENCOUNTER — Telehealth: Payer: Self-pay | Admitting: *Deleted

## 2011-04-08 ENCOUNTER — Other Ambulatory Visit: Payer: Self-pay | Admitting: Radiation Oncology

## 2011-04-08 ENCOUNTER — Ambulatory Visit: Payer: Medicare Other | Admitting: Radiation Oncology

## 2011-04-08 ENCOUNTER — Other Ambulatory Visit (HOSPITAL_COMMUNITY): Payer: Medicare Other

## 2011-04-08 DIAGNOSIS — D496 Neoplasm of unspecified behavior of brain: Secondary | ICD-10-CM

## 2011-04-09 ENCOUNTER — Other Ambulatory Visit: Payer: Self-pay | Admitting: Oncology

## 2011-04-09 ENCOUNTER — Encounter: Payer: Self-pay | Admitting: Radiation Oncology

## 2011-04-09 ENCOUNTER — Ambulatory Visit
Admission: RE | Admit: 2011-04-09 | Discharge: 2011-04-09 | Disposition: A | Discharge status: Home / Self Care | Payer: Medicare Other | Source: Ambulatory Visit | Attending: Radiation Oncology | Admitting: Radiation Oncology

## 2011-04-09 ENCOUNTER — Ambulatory Visit (HOSPITAL_COMMUNITY)
Admission: RE | Admit: 2011-04-09 | Discharge: 2011-04-09 | Disposition: A | Discharge status: Home / Self Care | Payer: Medicare Other | Source: Ambulatory Visit | Attending: Radiation Oncology | Admitting: Radiation Oncology

## 2011-04-09 ENCOUNTER — Encounter: Payer: Self-pay | Admitting: Oncology

## 2011-04-09 ENCOUNTER — Encounter (HOSPITAL_BASED_OUTPATIENT_CLINIC_OR_DEPARTMENT_OTHER): Payer: Medicare Other | Admitting: Oncology

## 2011-04-09 DIAGNOSIS — D432 Neoplasm of uncertain behavior of brain, unspecified: Secondary | ICD-10-CM | POA: Insufficient documentation

## 2011-04-09 DIAGNOSIS — M159 Polyosteoarthritis, unspecified: Secondary | ICD-10-CM | POA: Insufficient documentation

## 2011-04-09 DIAGNOSIS — Z510 Encounter for antineoplastic radiation therapy: Secondary | ICD-10-CM | POA: Insufficient documentation

## 2011-04-09 DIAGNOSIS — D496 Neoplasm of unspecified behavior of brain: Secondary | ICD-10-CM

## 2011-04-09 DIAGNOSIS — K8020 Calculus of gallbladder without cholecystitis without obstruction: Secondary | ICD-10-CM | POA: Insufficient documentation

## 2011-04-09 DIAGNOSIS — G9389 Other specified disorders of brain: Secondary | ICD-10-CM

## 2011-04-09 DIAGNOSIS — Z96659 Presence of unspecified artificial knee joint: Secondary | ICD-10-CM | POA: Insufficient documentation

## 2011-04-09 DIAGNOSIS — N433 Hydrocele, unspecified: Secondary | ICD-10-CM | POA: Insufficient documentation

## 2011-04-09 DIAGNOSIS — C711 Malignant neoplasm of frontal lobe: Secondary | ICD-10-CM | POA: Insufficient documentation

## 2011-04-09 DIAGNOSIS — M47817 Spondylosis without myelopathy or radiculopathy, lumbosacral region: Secondary | ICD-10-CM | POA: Insufficient documentation

## 2011-04-09 DIAGNOSIS — M1990 Unspecified osteoarthritis, unspecified site: Secondary | ICD-10-CM | POA: Insufficient documentation

## 2011-04-09 DIAGNOSIS — Z79899 Other long term (current) drug therapy: Secondary | ICD-10-CM | POA: Insufficient documentation

## 2011-04-09 LAB — CBC WITH DIFFERENTIAL/PLATELET
BASO%: 0.3 % (ref 0.0–2.0)
EOS%: 0 % (ref 0.0–7.0)
HGB: 14.6 g/dL (ref 13.0–17.1)
MCH: 32.2 pg (ref 27.2–33.4)
MCHC: 33.8 g/dL (ref 32.0–36.0)
MONO#: 0.8 10*3/uL (ref 0.1–0.9)
RDW: 14.2 % (ref 11.0–14.6)
WBC: 16.8 10*3/uL — ABNORMAL HIGH (ref 4.0–10.3)
lymph#: 1.7 10*3/uL (ref 0.9–3.3)

## 2011-04-09 LAB — COMPREHENSIVE METABOLIC PANEL
ALT: 50 U/L (ref 0–53)
AST: 23 U/L (ref 0–37)
Albumin: 3.2 g/dL — ABNORMAL LOW (ref 3.5–5.2)
Calcium: 8.5 mg/dL (ref 8.4–10.5)
Chloride: 96 mEq/L (ref 96–112)
Potassium: 3.6 mEq/L (ref 3.5–5.3)
Total Protein: 5.7 g/dL — ABNORMAL LOW (ref 6.0–8.3)

## 2011-04-09 MED ORDER — IOHEXOL 300 MG/ML  SOLN
100.0000 mL | Freq: Once | INTRAMUSCULAR | Status: AC | PRN
  Administered 2011-04-09: 100 mL via INTRAVENOUS

## 2011-04-12 NOTE — Discharge Summary (Signed)
  Mark Kerr, PASK NO.:  [id number]  MEDICAL RECORD NO.:  [id number]  LOCATION:  2458                         FACILITY:  [facility]  PHYSICIAN:  Earleen Newport, M.D.  DATE OF BIRTH:  [birth year]  DATE OF ADMISSION:  03/22/2011 DATE OF DISCHARGE:  03/25/2011                              DISCHARGE SUMMARY   ADMITTING DIAGNOSES:  Aphasia, confusion.  DISCHARGE AND FINAL DIAGNOSES: 1. 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 2. Lumbar spondylosis with chronic lumbar radiculopathy.  HOSPITAL COURSE:  The patient is widowed a 77 year old individual who has had problems with severe lumbar spondylosis.  I have been taking care of him in the past for this and he has undergone a surgical decompression little over a year ago.  The patient was in his usual state of health until approximately a week before admission he started having some difficulties with word finding and some apparent confusion was noted.  This became more evident when the patient was disoriented and he was brought to the hospital for further evaluation.  CT scan showed the presence of a lesion in the centrum semiovale more predominant on the left side, but some suggestion of extension to the right side.  He was then evaluated with an MRI which demonstrated classical findings for glioblastoma multiforme spreading across the midline into the opposite side.  The patient was started on high-dose Decadron therapy while in the hospital and this improved his mentation in addition to improving substantially his speech deficit.  He was able to ambulate.  He was able to mobilize without too much difficulty and at that time and as his mentation cleared we discussed the nature of the pathology and surgical treatment options versus conservative care.  I indicated that there was no good surgical treatment for this as the lesion is deep within the brain and crosses  to the opposite side of the midline.  I did offer the possibility of biopsy and consideration of radiation treatment.  After appear to clot, the patient felt that he would not like to have any aggressive therapy and opted to be discharged home.  He was discharged home on 4 mg of Decadron being given 3 times a day in addition to Pepcid to protect his stomach.  His other home medications have remained unchanged.  CONDITION ON DISCHARGE:  Stable, improved.  He will be followed up as an outpatient.     Earleen Newport, M.D.     Mark Kerr  D:  04/10/2011  T:  04/10/2011  Job:  [id number]  Electronically Signed by Kristeen Miss M.D. on 04/12/2011 10:21:26 PM

## 2011-04-13 ENCOUNTER — Other Ambulatory Visit: Payer: Medicare Other

## 2011-04-13 ENCOUNTER — Ambulatory Visit (HOSPITAL_COMMUNITY)
Admission: RE | Admit: 2011-04-13 | Discharge: 2011-04-13 | Disposition: A | Discharge status: Home / Self Care | Payer: Medicare Other | Source: Ambulatory Visit | Attending: Neurological Surgery | Admitting: Neurological Surgery

## 2011-04-13 ENCOUNTER — Other Ambulatory Visit: Payer: Self-pay | Admitting: Neurological Surgery

## 2011-04-13 DIAGNOSIS — C7931 Secondary malignant neoplasm of brain: Secondary | ICD-10-CM

## 2011-04-13 MED ORDER — GADOBENATE DIMEGLUMINE 529 MG/ML IV SOLN
15.0000 mL | Freq: Once | INTRAVENOUS | Status: AC | PRN
  Administered 2011-04-13: 15 mL via INTRAVENOUS

## 2011-04-14 ENCOUNTER — Inpatient Hospital Stay (HOSPITAL_COMMUNITY): Payer: Medicare Other | Admitting: Anesthesiology

## 2011-04-14 ENCOUNTER — Encounter (HOSPITAL_COMMUNITY): Payer: Self-pay | Admitting: *Deleted

## 2011-04-14 ENCOUNTER — Encounter (HOSPITAL_COMMUNITY)
Admission: RE | Disposition: A | Discharge status: Home / Self Care | Payer: Self-pay | Source: Ambulatory Visit | Attending: Neurological Surgery

## 2011-04-14 ENCOUNTER — Encounter (HOSPITAL_COMMUNITY): Payer: Self-pay | Admitting: Anesthesiology

## 2011-04-14 ENCOUNTER — Other Ambulatory Visit: Payer: Self-pay | Admitting: Neurological Surgery

## 2011-04-14 ENCOUNTER — Inpatient Hospital Stay (HOSPITAL_COMMUNITY)
Admission: RE | Admit: 2011-04-14 | Discharge: 2011-04-15 | DRG: 055 | Disposition: A | Discharge status: Home / Self Care | Payer: Medicare Other | Source: Ambulatory Visit | Attending: Neurological Surgery | Admitting: Neurological Surgery

## 2011-04-14 DIAGNOSIS — Z981 Arthrodesis status: Secondary | ICD-10-CM

## 2011-04-14 DIAGNOSIS — Z79899 Other long term (current) drug therapy: Secondary | ICD-10-CM

## 2011-04-14 DIAGNOSIS — G9389 Other specified disorders of brain: Secondary | ICD-10-CM

## 2011-04-14 DIAGNOSIS — C719 Malignant neoplasm of brain, unspecified: Principal | ICD-10-CM | POA: Diagnosis present

## 2011-04-14 DIAGNOSIS — Z96659 Presence of unspecified artificial knee joint: Secondary | ICD-10-CM

## 2011-04-14 HISTORY — PX: STERIOTACTIC STIMULATOR INSERTION: SHX5374

## 2011-04-14 LAB — PROTIME-INR: Prothrombin Time: 13.6 seconds (ref 11.6–15.2)

## 2011-04-14 LAB — CBC
HCT: 42.4 % (ref 39.0–52.0)
Hemoglobin: 14.8 g/dL (ref 13.0–17.0)
MCH: 32.5 pg (ref 26.0–34.0)
MCV: 93 fL (ref 78.0–100.0)
RBC: 4.56 MIL/uL (ref 4.22–5.81)

## 2011-04-14 LAB — BASIC METABOLIC PANEL
BUN: 19 mg/dL (ref 6–23)
CO2: 31 mEq/L (ref 19–32)
Chloride: 100 mEq/L (ref 96–112)
Glucose, Bld: 88 mg/dL (ref 70–99)
Potassium: 4.6 mEq/L (ref 3.5–5.1)
Sodium: 137 mEq/L (ref 135–145)

## 2011-04-14 LAB — SURGICAL PCR SCREEN: Staphylococcus aureus: POSITIVE — AB

## 2011-04-14 SURGERY — STERIOTACTIC BIOPSY
Anesthesia: General | Wound class: Clean

## 2011-04-14 MED ORDER — DEXAMETHASONE SODIUM PHOSPHATE 10 MG/ML IJ SOLN
INTRAMUSCULAR | Status: DC | PRN
  Administered 2011-04-14: 10 mg via INTRAVENOUS

## 2011-04-14 MED ORDER — VECURONIUM BROMIDE 10 MG IV SOLR
INTRAVENOUS | Status: DC | PRN
  Administered 2011-04-14: 1 mg via INTRAVENOUS
  Administered 2011-04-14: 2 mg via INTRAVENOUS
  Administered 2011-04-14 (×3): 1 mg via INTRAVENOUS

## 2011-04-14 MED ORDER — ROCURONIUM BROMIDE 100 MG/10ML IV SOLN
INTRAVENOUS | Status: DC | PRN
  Administered 2011-04-14: 45 mg via INTRAVENOUS

## 2011-04-14 MED ORDER — MORPHINE SULFATE 4 MG/ML IJ SOLN: 1.0000 mg | INTRAMUSCULAR | Status: DC | PRN

## 2011-04-14 MED ORDER — ONDANSETRON HCL 4 MG/2ML IJ SOLN: 4.0000 mg | Freq: Once | INTRAMUSCULAR | Status: DC | PRN

## 2011-04-14 MED ORDER — MENTHOL 3 MG MT LOZG: 1.0000 | LOZENGE | OROMUCOSAL | Status: DC | PRN

## 2011-04-14 MED ORDER — FAMOTIDINE 20 MG PO TABS
20.0000 mg | ORAL_TABLET | Freq: Two times a day (BID) | ORAL | Status: DC
  Administered 2011-04-14 – 2011-04-15 (×3): 20 mg via ORAL
  Filled 2011-04-14 (×4): qty 1

## 2011-04-14 MED ORDER — MUPIROCIN 2 % EX OINT
TOPICAL_OINTMENT | Freq: Two times a day (BID) | CUTANEOUS | Status: DC
  Administered 2011-04-14: 08:00:00 via NASAL
  Filled 2011-04-14: qty 22

## 2011-04-14 MED ORDER — SODIUM CHLORIDE 0.9 % IV SOLN
INTRAVENOUS | Status: DC
  Filled 2011-04-14: qty 1000

## 2011-04-14 MED ORDER — CEFAZOLIN SODIUM 1-5 GM-% IV SOLN
INTRAVENOUS | Status: DC | PRN
  Administered 2011-04-14: 1 g via INTRAVENOUS

## 2011-04-14 MED ORDER — HYDROMORPHONE HCL PF 1 MG/ML IJ SOLN: 0.2500 mg | INTRAMUSCULAR | Status: DC | PRN

## 2011-04-14 MED ORDER — ONDANSETRON HCL 4 MG/2ML IJ SOLN
INTRAMUSCULAR | Status: DC | PRN
  Administered 2011-04-14: 4 mg via INTRAVENOUS

## 2011-04-14 MED ORDER — SODIUM CHLORIDE 0.9 % IV SOLN
INTRAVENOUS | Status: DC
  Administered 2011-04-14: 15:00:00 via INTRAVENOUS

## 2011-04-14 MED ORDER — SODIUM CHLORIDE 0.9 % IJ SOLN: 3.0000 mL | INTRAMUSCULAR | Status: DC | PRN

## 2011-04-14 MED ORDER — SODIUM CHLORIDE 0.9 % IJ SOLN
3.0000 mL | Freq: Two times a day (BID) | INTRAMUSCULAR | Status: DC
  Administered 2011-04-14 (×2): 3 mL via INTRAVENOUS

## 2011-04-14 MED ORDER — ALUM & MAG HYDROXIDE-SIMETH 400-400-40 MG/5ML PO SUSP
30.0000 mL | Freq: Four times a day (QID) | ORAL | Status: DC | PRN
  Filled 2011-04-14: qty 30

## 2011-04-14 MED ORDER — FENTANYL CITRATE 0.05 MG/ML IJ SOLN
INTRAMUSCULAR | Status: DC | PRN
  Administered 2011-04-14: 50 ug via INTRAVENOUS
  Administered 2011-04-14: 100 ug via INTRAVENOUS

## 2011-04-14 MED ORDER — ISOPROPYL ALCOHOL 70 % SOLN
Status: DC | PRN
  Administered 2011-04-14: 1 via TOPICAL

## 2011-04-14 MED ORDER — SODIUM CHLORIDE 0.9 % IV SOLN: 250.0000 mL | INTRAVENOUS | Status: DC

## 2011-04-14 MED ORDER — HYDROCODONE-ACETAMINOPHEN 5-325 MG PO TABS: 1.0000 | ORAL_TABLET | ORAL | Status: DC | PRN

## 2011-04-14 MED ORDER — DEXAMETHASONE 6 MG PO TABS
6.0000 mg | ORAL_TABLET | Freq: Three times a day (TID) | ORAL | Status: DC
  Administered 2011-04-14 – 2011-04-15 (×3): 6 mg via ORAL
  Filled 2011-04-14 (×5): qty 1

## 2011-04-14 MED ORDER — EPHEDRINE SULFATE 50 MG/ML IJ SOLN
INTRAMUSCULAR | Status: DC | PRN
  Administered 2011-04-14: 5 mg via INTRAVENOUS

## 2011-04-14 MED ORDER — PHENOL 1.4 % MT LIQD: 1.0000 | OROMUCOSAL | Status: DC | PRN

## 2011-04-14 MED ORDER — GLYCOPYRROLATE 0.2 MG/ML IJ SOLN
INTRAMUSCULAR | Status: DC | PRN
  Administered 2011-04-14: .8 mg via INTRAVENOUS

## 2011-04-14 MED ORDER — CEFAZOLIN SODIUM 1-5 GM-% IV SOLN
1.0000 g | Freq: Once | INTRAVENOUS | Status: DC
  Filled 2011-04-14: qty 50

## 2011-04-14 MED ORDER — SODIUM CHLORIDE 0.9 % IV SOLN
INTRAVENOUS | Status: DC | PRN
  Administered 2011-04-14 (×2): via INTRAVENOUS

## 2011-04-14 MED ORDER — THROMBIN 5000 UNITS EX KIT
PACK | CUTANEOUS | Status: DC | PRN
  Administered 2011-04-14: 5000 [IU] via TOPICAL

## 2011-04-14 MED ORDER — ACETAMINOPHEN 325 MG PO TABS: 650.0000 mg | ORAL_TABLET | ORAL | Status: DC | PRN

## 2011-04-14 MED ORDER — DOCUSATE SODIUM 100 MG PO CAPS
100.0000 mg | ORAL_CAPSULE | Freq: Two times a day (BID) | ORAL | Status: DC
  Administered 2011-04-14 – 2011-04-15 (×3): 100 mg via ORAL
  Filled 2011-04-14 (×4): qty 1

## 2011-04-14 MED ORDER — ONDANSETRON HCL 4 MG/2ML IJ SOLN: 4.0000 mg | INTRAMUSCULAR | Status: DC | PRN

## 2011-04-14 MED ORDER — NEOSTIGMINE METHYLSULFATE 1 MG/ML IJ SOLN
INTRAMUSCULAR | Status: DC | PRN
  Administered 2011-04-14: 5 mg via INTRAVENOUS

## 2011-04-14 MED ORDER — PROPOFOL 10 MG/ML IV EMUL
INTRAVENOUS | Status: DC | PRN
  Administered 2011-04-14: 150 mg via INTRAVENOUS

## 2011-04-14 MED ORDER — ACETAMINOPHEN 650 MG RE SUPP: 650.0000 mg | RECTAL | Status: DC | PRN

## 2011-04-14 MED ORDER — HYDROCORTISONE SOD SUCCINATE 1000 MG IJ SOLR
INTRAMUSCULAR | Status: DC | PRN
  Administered 2011-04-14: 100 mg via INTRAVENOUS

## 2011-04-14 SURGICAL SUPPLY — 74 items
APL SKNCLS STERI-STRIP NONHPOA (GAUZE/BANDAGES/DRESSINGS)
BANDAGE ADHESIVE 1X3 (GAUZE/BANDAGES/DRESSINGS) IMPLANT
BENZOIN TINCTURE PRP APPL 2/3 (GAUZE/BANDAGES/DRESSINGS) IMPLANT
BLADE SURG 10 STRL SS (BLADE) ×2 IMPLANT
BLADE SURG 15 STRL LF DISP TIS (BLADE) ×1 IMPLANT
BLADE SURG 15 STRL SS (BLADE) ×2
BLADE SURG ROTATE 9660 (MISCELLANEOUS) ×2 IMPLANT
BRUSH SCRUB EZ PLAIN DRY (MISCELLANEOUS) ×1 IMPLANT
BUR ACORN 6.0 (BURR) ×2 IMPLANT
CANISTER SUCTION 2500CC (MISCELLANEOUS) ×2 IMPLANT
CLOTH BEACON ORANGE TIMEOUT ST (SAFETY) ×2 IMPLANT
CONT SPEC 4OZ CLIKSEAL STRL BL (MISCELLANEOUS) ×6 IMPLANT
CORDS BIPOLAR (ELECTRODE) ×2 IMPLANT
COVER MAYO STAND STRL (DRAPES) ×2 IMPLANT
COVER TABLE BACK 60X90 (DRAPES) ×2 IMPLANT
DECANTER SPIKE VIAL GLASS SM (MISCELLANEOUS) ×2 IMPLANT
DRAPE INCISE IOBAN 66X45 STRL (DRAPES) ×2 IMPLANT
DRAPE POUCH INSTRU U-SHP 10X18 (DRAPES) ×2 IMPLANT
DRESSING TELFA 8X3 (GAUZE/BANDAGES/DRESSINGS) ×1 IMPLANT
DRSG OPSITE 4X5.5 SM (GAUZE/BANDAGES/DRESSINGS) ×1 IMPLANT
DURAPREP 26ML APPLICATOR (WOUND CARE) ×1 IMPLANT
GAUZE SPONGE 4X4 16PLY XRAY LF (GAUZE/BANDAGES/DRESSINGS) ×4 IMPLANT
GLOVE BIO SURGEON STRL SZ7.5 (GLOVE) IMPLANT
GLOVE BIOGEL PI IND STRL 7.0 (GLOVE) IMPLANT
GLOVE BIOGEL PI IND STRL 7.5 (GLOVE) IMPLANT
GLOVE BIOGEL PI IND STRL 8.5 (GLOVE) ×1 IMPLANT
GLOVE BIOGEL PI INDICATOR 7.0 (GLOVE) ×2
GLOVE BIOGEL PI INDICATOR 7.5 (GLOVE) ×1
GLOVE BIOGEL PI INDICATOR 8.5 (GLOVE) ×1
GLOVE ECLIPSE 7.5 STRL STRAW (GLOVE) ×2 IMPLANT
GLOVE ECLIPSE 8.5 STRL (GLOVE) ×4 IMPLANT
GLOVE EXAM NITRILE LRG STRL (GLOVE) IMPLANT
GLOVE EXAM NITRILE MD LF STRL (GLOVE) IMPLANT
GLOVE EXAM NITRILE XL STR (GLOVE) IMPLANT
GLOVE EXAM NITRILE XS STR PU (GLOVE) IMPLANT
GLOVE SS BIOGEL STRL SZ 6.5 (GLOVE) IMPLANT
GLOVE SUPERSENSE BIOGEL SZ 6.5 (GLOVE) ×2
GLOVE SURG SS PI 6.5 STRL IVOR (GLOVE) ×1 IMPLANT
GOWN BRE IMP SLV AUR LG STRL (GOWN DISPOSABLE) ×2 IMPLANT
GOWN BRE IMP SLV AUR XL STRL (GOWN DISPOSABLE) ×1 IMPLANT
GOWN STRL REIN 2XL LVL4 (GOWN DISPOSABLE) ×2 IMPLANT
KIT BASIN OR (CUSTOM PROCEDURE TRAY) ×2 IMPLANT
KIT NDL BIOPSY PASSIVE (NEEDLE) IMPLANT
KIT NEEDLE BIOPSY PASSIVE (NEEDLE) ×2 IMPLANT
KIT ROOM TURNOVER OR (KITS) ×2 IMPLANT
KIT TRAJECTORY GUIDE EXTERNAL (MISCELLANEOUS) ×1 IMPLANT
MARKER SPHERE PSV REFLC NDI (MISCELLANEOUS) ×3 IMPLANT
NDL HYPO 25X1 1.5 SAFETY (NEEDLE) ×2 IMPLANT
NDL SPNL 18GX3.5 QUINCKE PK (NEEDLE) IMPLANT
NEEDLE HYPO 25X1 1.5 SAFETY (NEEDLE) ×4 IMPLANT
NEEDLE SPNL 18GX3.5 QUINCKE PK (NEEDLE) IMPLANT
NS IRRIG 1000ML POUR BTL (IV SOLUTION) ×3 IMPLANT
PACK EENT II TURBAN DRAPE (CUSTOM PROCEDURE TRAY) ×2 IMPLANT
PAD ARMBOARD 7.5X6 YLW CONV (MISCELLANEOUS) ×6 IMPLANT
PATTIES SURGICAL .5 X.5 (GAUZE/BANDAGES/DRESSINGS) IMPLANT
PENCIL BUTTON HOLSTER BLD 10FT (ELECTRODE) ×2 IMPLANT
SPECIMEN JAR SMALL (MISCELLANEOUS) IMPLANT
SPONGE GAUZE 4X4 12PLY (GAUZE/BANDAGES/DRESSINGS) IMPLANT
SPONGE SURGIFOAM ABS GEL SZ50 (HEMOSTASIS) IMPLANT
STAPLER SKIN PROX WIDE 3.9 (STAPLE) ×2 IMPLANT
SUT BONE WAX W31G (SUTURE) ×2 IMPLANT
SUT BOOTS LG PINK 1001 516 (MISCELLANEOUS) IMPLANT
SUT ETHILON 3 0 FSL (SUTURE) IMPLANT
SUT VIC AB 2-0 CP2 18 (SUTURE) ×2 IMPLANT
SUT VIC AB 3-0 SH 8-18 (SUTURE) ×1 IMPLANT
SYR 3ML LL SCALE MARK (SYRINGE) ×1 IMPLANT
SYR 5ML LUER SLIP (SYRINGE) ×4 IMPLANT
SYR BULB 3OZ (MISCELLANEOUS) ×2 IMPLANT
SYR CONTROL 10ML LL (SYRINGE) ×4 IMPLANT
SYRINGE 10CC LL (SYRINGE) IMPLANT
TOWEL OR 17X24 6PK STRL BLUE (TOWEL DISPOSABLE) ×2 IMPLANT
TOWEL OR 17X26 10 PK STRL BLUE (TOWEL DISPOSABLE) ×2 IMPLANT
TUBE CONNECTING 12X1/4 (SUCTIONS) ×2 IMPLANT
WATER STERILE IRR 1000ML POUR (IV SOLUTION) ×2 IMPLANT

## 2011-04-14 NOTE — Preoperative (Addendum)
Beta Blockers   Reason not to administer Beta Blockers:Not Applicable 

## 2011-04-14 NOTE — Progress Notes (Signed)
Subjective: Patient reports Offers no complaints. Feels speech is about the same.  Objective: Vital signs in last 24 hours: Temp:  [97 F (36.1 C)-98.3 F (36.8 C)] 98 F (36.7 C) (11/06 1600) Pulse Rate:  [57-69] 69  (11/06 1900) Resp:  [10-19] 19  (11/06 1900) BP: (116-145)/(62-84) 116/65 mmHg (11/06 1900) SpO2:  [93 %-99 %] 95 % (11/06 1900) Weight:  [69.4 kg (153 lb)] 153 lb (69.4 kg) (11/06 1449)  Intake/Output from previous day:   Intake/Output this shift:    Alert  Oriented with mild dysphasia. No drift motor function is good. No sensory abnormality.  Lab Results:  [facility] 04/14/11 0817  WBC 16.2*  HGB 14.8  HCT 42.4  PLT 122*   BMET  Basename 04/14/11 0817  NA 137  K 4.6  CL 100  CO2 31  GLUCOSE 88  BUN 19  CREATININE 0.64  CALCIUM 8.8    Studies/Results: Mr Jeri Cos OX Contrast  04/13/2011  *RADIOLOGY REPORT*  Clinical Data: Confusion.  Evaluate mass lesion.  Preop stealth.  MRI HEAD WITHOUT AND WITH CONTRAST  Technique:  Multiplanar, multiecho pulse sequences of the brain and surrounding structures were obtained according to standard protocol without and with intravenous contrast  Contrast: 34mL MULTIHANCE GADOBENATE DIMEGLUMINE 529 MG/ML IV SOLN  Comparison: CT head and MR head 03/22/2011.  Findings: Redemonstrated is a large left posterior frontal parasagittal periventricular mass invading the corpus callosum from left to right.  There is diminished intensity of enhancement, likely related to steroid administration. Hemorrhagic component not optimally evaluated with stealth protocol.  Cross-sectional measurements are roughly the same measuring 58 x 32 x 45 mm.  There is diminished surrounding vasogenic edema compared to priors.  Mild ventricular enlargement is noted, likely age related.  Mild chronic microvascular ischemic change in the periventricular white matter redemonstrated.  IMPRESSION: Intra-axial lesion invading the corpus callosum as described.  Differential remains GBM versus lymphoma.  Diminished enhancement compared to 03/22/2011 likely related to steroid administration.  Original Report Authenticated By: Staci Righter, M.D.    Assessment/Plan: Stable post op.  LOS: 0 days  Dc aline, foley. Hep well ivf. Out of bed ambulate.   Mark Kerr 04/14/2011, 7:35 PM

## 2011-04-14 NOTE — Op Note (Signed)
Preoperative diagnosis: Brain tumor, suspected glioblastoma Post Operative diagnosis: Brain tumor, glioblastoma Procedure: Stereotactic brain biopsy using frameless stereotactic system Surgeon: Barnett Abu M.D. Anesthesia: Gen. endotracheal Indications: The patient is a 77 year old white male who developed difficulties with his speech and some confusion. 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 A stereotactic brain biopsy is now being performed to verify the diagnosis. Procedure: The patient was brought to the operating room placed on the table in the supine position. After the smooth induction of general endotracheal anesthesia, he was placed in a 3 pin head rest in the supine position with the head slightly flexed. The Stealth stereotactic arm was guided over the patient's head and stereotactic registry of the patient's head with his MRI was performed. Once the accuracy was verified an entry site was chosen with the appropriate trajectory as guided by the image guidance system. The scalp was then prepped with alcohol and DuraPrep skin about the area was shaved and then the area was draped sterilely. A vertical incision was created and carried down through the galea. The pericranium was stripped from the bone and a self-retaining retractor was placed in the wound. A burr hole was placed at the center of the chosen for trajectory and the dura was ultimately identified. A bur hole was enlarged slightly with a 2 mm Kerrison punch the Navigus probe was then used to verify positioning after the stereotactic base was placed on the patient's skull and secured with 3 locking screws. The Navigus probe was used to verify the trajectory and once this was doubly verified independently locked the system into place. The dura was perforated with a #15 blade. A stereotactic probe was then placed to the target lesion of verify the target with the stereotactic imaging  available.  A number of aspiration biopsies were obtained and this revealed some bloody hemorrhagic fluid consistent with necrotic fluid and tissue. After 3 such aspirations some core of tissue was obtained and the core tissue was sent for frozen section diagnosis a total of 4 sections were sent for this purpose. 6 additional sections of tissue were obtained along with some additional necrotic fluid. The sections were sent separately. The debris from the stereotactic aspirations were sent as a separate specimen. On the last draw of specimen spinal fluid was obtained. At this point it was decided to complete the procedure. The stereotactic probe was removed along with the Navigus base. The wound was irrigated copiously with antibiotic irrigating solution the galea was closed with 2-0 Vicryl in an inverted interrupted fashion. Surgical staples were placed in the scalp. A dry sterile dressing was placed over this and the patient's head was removed from the 3 pin headrest he was awakened and returned to the recovery area. Blood loss for the procedure was nil. 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

## 2011-04-14 NOTE — H&P (View-Only) (Signed)
NAMETYON, CERASOLI NO.:  [id number]  MEDICAL RECORD NO.:  [id number]  LOCATION:  1856                         FACILITY:  [facility]  PHYSICIAN:  Eustace Moore, MD     DATE OF BIRTH:  Jun 15, 1930  DATE OF ADMISSION:  03/22/2011 DATE OF DISCHARGE:                             HISTORY & PHYSICAL   ADMITTING DIAGNOSIS:  Brain mass.  HISTORY OF PRESENT ILLNESS:  Mr. Arcidiacono is a 77 year old gentleman who presents to the emergency department with a 2-week history of difficulty "finishing my sentences." He has headaches only when he coughs, no visual changes.  No numbness or weakness in the extremities.  His family states that about 6 weeks ago he was getting to the car and felt something pop in his hip and did have some trouble lifting the leg after that, but he reported to his family that has improved.  CT scan done in the emergency department showed a brain mass and Neurosurgical evaluation was requested.  PAST SURGERY HISTORY: 1. Posterior lumbar interbody fusion L4-5 by Dr. Kristeen Miss in 2010. 2. Achilles tendon repair. 3. Bilateral knee replacements.  MEDICATIONS:  Voltaren.  ALLERGIES:  No known drug allergies.  SOCIAL HISTORY:  Denies use of tobacco or alcohol products.  REVIEW OF SYSTEMS:  Otherwise, negative.  PHYSICAL EXAMINATION:  VITAL SIGNS:  Pulse 75, respirations 16.  He is afebrile. GENERAL:  Pleasant and cooperative male lying in stretcher. HEENT:  Normocephalic, atraumatic.  Extraocular movements are intact. NECK:  Supple. HEART:  Regular rate and rhythm. EXTREMITIES:  No obvious deformities. NEUROLOGIC:  He is awake and alert, pleasant, interactive.  He has good naming, good repetition, good comprehension.  He has difficulty with fluency.  He has good attention span.  His memory appears to be good. His extraocular movements are intact.  His pupils are equal and reactive.  His face looks symmetric.  The tongue protrudes in the midline.   He can puff out his cheeks.  He has good shoulder shrug.  He has no pronator drift.  He seems to have good strength throughout his upper and lower extremities.  Gait is not tested.  IMAGING STUDIES:  CT scan of the brain I have reviewed as well as report there is a mass in the left frontal region which appears to cross over the superior part of the corpus callosum.  There is some edema, some mass effect, and a mild amount of shift.  ASSESSMENT AND PLAN:  This is a 77 year old with a left frontal brain mass.  He has no history of cancer but this still could be a metastatic lesion.  It could also be a primary brain lesion such as a GBM or lymphoma.  Common things being common this is likely a GBM, however, it looks a little organized on 2 of the cuts and therefore, I would like to go ahead and get an MRI of the brain with and without contrast to get a better evaluation of this in all 3 planes.  I have explained to the family that he does have a brain mass.  If it looks like a primary tumor by MRI, then I doubt  he will be a surgical candidate because it will then be a "butterfly" GBM and will likely require simple palliative treatment only such as radiation therapy plus or minus oral chemotherapy.  However, if it looks more like some other lesion then may be a candidate for biopsy or even section depending upon the appearance and location of the lesion.  We will go ahead and put him on Decadron as this does not look like an infection of any type.  I do not want to put him on an anti-epileptic medication at this point.  We will get him admitted and make further treatment plans once the MRI is done.     Eustace Moore, MD     DSJ/MEDQ  D:  03/22/2011  T:  03/23/2011  Job:  [id number]  Electronically Signed by Sherley Bounds MD on 03/27/2011 10:50:51 AM

## 2011-04-14 NOTE — Interval H&P Note (Signed)
History and Physical Interval Note:   04/14/2011   8:07 AM   Marrion Coy  has presented today for surgery, with the diagnosis of brain tumor   The various methods of treatment have been discussed with the patient and family. After consideration of risks, benefits and other options for treatment, the patient has consented to  Procedure(s): STERIOTACTIC BIOPSY as a surgical intervention .  The patients' history has been reviewed, patient examined, no change in status, stable for surgery.  I have reviewed the patients' chart and labs.  Questions were answered to the patient's satisfaction.     Stefani Dama  MD   Mr. Faucett returns to the hospital today to undergo stereotactic brain biopsy. Since his discharge he has seen additional specialists who recommended brain biopsy and consideration of radiation treatment. I had a phone conversation with the patient's son and he indicated that Mr. Kauffman desired to proceed with stereotactic brain biopsy. A stereotactic MRI has been obtained. In the interval Mr. Noblet has been doing well while on Decadron. No other changes are noted in the history of present illness past medical history systems review and his physical exam. He still has a mild dysphasia.

## 2011-04-14 NOTE — Transfer of Care (Signed)
Immediate Anesthesia Transfer of Care Note  Patient: Mark Kerr  Procedure(s) Performed:  STERIOTACTIC BIOPSY - Brain Biopsy with Stealth Prooacal  Patient Location: PACU  Anesthesia Type: General  Level of Consciousness: awake  Airway & Oxygen Therapy: Patient Spontanous Breathing and Patient connected to face mask oxygen  Post-op Assessment: Report given to PACU RN, Post -op Vital signs reviewed and stable, Patient moving all extremities X 4 and Patient able to stick tongue midline  Post vital signs: stable  Complications: No apparent anesthesia complications

## 2011-04-14 NOTE — Anesthesia Postprocedure Evaluation (Signed)
  Anesthesia Post-op Note  Patient: Mark Kerr  Procedure(s) Performed:  STERIOTACTIC BIOPSY - Brain Biopsy with Stealth Prooacal  Patient Location: PACU  Anesthesia Type: General  Level of Consciousness: alert   Airway and Oxygen Therapy: Patient connected to nasal cannula oxygen  Post-op Pain: mild  Post-op Assessment: Post-op Vital signs reviewed  Post-op Vital Signs: stable  Complications: No apparent anesthesia complications

## 2011-04-14 NOTE — Anesthesia Preprocedure Evaluation (Addendum)
Anesthesia Evaluation  Patient identified by MRN, date of birth, ID band Patient awake    Airway Mallampati: I      Dental  (+) Teeth Intact   Pulmonary  clear to auscultation        Cardiovascular regular Normal    Neuro/Psych    GI/Hepatic GERD-  ,  Endo/Other    Renal/GU      Musculoskeletal   Abdominal   Peds  Hematology   Anesthesia Other Findings   Reproductive/Obstetrics                           Anesthesia Physical Anesthesia Plan  ASA: III  Anesthesia Plan: General   Post-op Pain Management:    Induction:   Airway Management Planned:   Additional Equipment:   Intra-op Plan:   Post-operative Plan:   Informed Consent: I have reviewed the patients History and Physical, chart, labs and discussed the procedure including the risks, benefits and alternatives for the proposed anesthesia with the patient or authorized representative who has indicated his/her understanding and acceptance.   Dental Advisory Given  Plan Discussed with:   Anesthesia Plan Comments:         Anesthesia Quick Evaluation

## 2011-04-14 NOTE — Anesthesia Procedure Notes (Addendum)
Procedure Name: Intubation Date/Time: 04/14/2011 11:36 AM Performed by: Romie Minus Pre-anesthesia Checklist: Patient identified, Emergency Drugs available, Suction available and Patient being monitored Patient Re-evaluated:Patient Re-evaluated prior to inductionOxygen Delivery Method: Circle System Utilized Preoxygenation: Pre-oxygenation with 100% oxygen Intubation Type: IV induction Ventilation: Mask ventilation without difficulty and Oral airway inserted - appropriate to patient size Laryngoscope Size: Miller and 3 Grade View: Grade I Tube type: Oral Tube size: 7.5 mm Number of attempts: 1 Placement Confirmation: ETT inserted through vocal cords under direct vision,  positive ETCO2 and breath sounds checked- equal and bilateral Secured at: 21 cm Tube secured with: Tape Dental Injury: Teeth and Oropharynx as per pre-operative assessment

## 2011-04-14 NOTE — Progress Notes (Signed)
CC: Lora Paula, M.D.  Jeannette How. Perini, M.D.  Earleen Newport, M.D.   CHIEF COMPLAINT:  Expressive aphasia and memory loss.  REASON FOR REFERRAL:  New brain mass; suspected glioblastoma multiforme.  HISTORY OF PRESENT ILLNESS:  Mr. Blue is a 77 year old Caucasian man from [place], [place].  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  He noticed that he has been having expressive aphasia with both speech and writing.  He understands what people are trying to tell him however he has problem expressing his thoughts.  His son also has noticed he has been having short-term memory loss, getting worse in the last few weeks.  They presented to Dr. Joylene Draft and he was evaluated with a head CT scan which I reviewed myself personally which was performed 03/22/2011 which showed a 3 x 5 cm frontal mass crossing the anterior corpus callosum with vasogenic edema with some midline shift.  Having this report Dr. Joylene Draft started the patient on dexamethasone 6 mg p.o. 3 times a day and he got an MRI of the brain with and without contrast on 03/23/2011 which again I reviewed myself personally which showed an approximately 5 cm irregular mass with mild amount of hemorrhage.  This mass was in the body of the corpus callosum extending to the periventricular white matter on the left side mainly.  There was no other mass present on this MRI per report by Dr. Carl Best.  This is most likely consistent with glioblastoma multiforme.  Less likely primary CNS lymphoma however cannot be ruled out definitively without tissue diagnosis.     Mr. Sleight was evaluated by Dr. Tyler Pita from radiation oncology yesterday and the plan is for staging CT of the chest, abdomen, and pelvis which apparently did not show any metastatic disease.  I reviewed the CT chest/abd/pelvic myself which did not show abnormal findings.  Dr. Tammi Klippel had already  discussed with Dr. Ellene Route and the potential referral to Dr. Ellene Route for tissue diagnosis given the fact that there is expression of tissue extension of the mass beyond corpus callosum.   Mr. Ricke presented today for the first time with one of his sons, he has 3 sons.  He reports that since he started on dexamethasone his expressive aphasia has slightly improve however has not completely resolved yet.  He denied any headache, visual changes, nausea, vomiting.  He has problem with his right lower extremity pain because of degenerative joint disease for many years in the past.  However, he has not had any new focal weakness for the past 2 months.  He denies any problem with dexamethasone such as fluid retention, insomnia or polyphagia or polydipsia.  He denied any nausea or vomiting, shortness of breath, mucositis, chest pain, productive cough, hemoptysis, palpitation, pedal edema, low back pain that is outside the normal for him.  The rest of the 14 point review of systems was negative.  PAST MEDICAL HISTORY:  Degenerative joint disease.  PAST SURGICAL HISTORY:  Total knee replacement in 1991 and 2002 in addition to back surgery.  PRIMARY CARE:  He had a colonoscopy about 5 years ago with Dr. Olevia Perches that was reportedly negative per his report.   OUTPATIENT MEDICATION:   1. Aspirin with caffeine 81 mg per day every 2 weeks. 2. Dexamethasone 6 mg p.o. 3 times a day. 3. Ergocalciferol 2000 mg p.o. daily. 4. Famotidine one tablet p.o. daily  p.r.n.  5. Saw palmetto 1 tab p.o. daily.  ALLERGIES:  No known drug allergies.  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  He is a widower.  His wife died from Alzheimer's dementia.  He has 3 sons, 2 of whom live nearby and 1 lives in [place].  The son who is with him today is Reuven Braver who is most involved in his life.  He denied history of smoking, alcohol or IV drug use.  He still lives by himself.  His son lives  about 5 miles away from him.  A friend cooks and brings over a meal however he is able to clean his house, get dressed and shower himself without problem.  FAMILY HISTORY:  Both father and mother deceased from alcohol abuse.  He has 2 sisters, 1 died from plane crash, the other is alive with no major medical problem.  He has 3 sons relatively healthy.  PHYSICAL EXAMINATION:  Vital signs:  Temperature 97.0, heart rate 67, respiratory rate 20, blood pressure 135/72, weight of 154.1 pounds.  ECOG performance status is 1.    General:  well-nourished in no acute distress.  Eyes:  no scleral icterus.  ENT:  There were no oropharyngeal lesions.  Neck was without thyromegaly.  Lymphatics:  Negative cervical, supraclavicular or axillary adenopathy.  Respiratory: lungs were clear bilaterally without wheezing or crackles.  Cardiovascular:  Regular rate and rhythm, S1/S2, without murmur, rub or gallop.  There was no pedal edema.  GI:  abdomen was soft, flat, nontender, nondistended, without organomegaly.  Muscoloskeletal:  no spinal tenderness of palpation of vertebral spine.  Skin exam was without echymosis, petichae.  Patient was able to get on and off exam table without assistance.  Gait was normal.  Patient was alerted and oriented.  Attention was good.   Language was appropriate.  Mood was normal without depression.  Speech was not pressured.  Thought content was not tangential.   Neurological:  Alert and oriented times 4.  He does have problem with expressive aphasia.  He was able to follow conversation very well and ask very appropriate questions about prognosis and the need for workup and biopsy.  Cranial nerves II-XII grossly intact.  His motor strength was 5/5 both upper and lower extremities.  His gait showed wobbly gait because of weakness in the right hip but this is not related to his new diagnosis.  His deep tendon reflex was 2+ bilaterally upper and lower extremities.  LAB:  WBC 16.8, hemoglobin 14.6,  platelets count 170, creatinine 0.87, LFT within normal limit except for total protein 5.7, albumin of 3.2, calcium 8.5, LDH 217.  ASSESSMENT AND PLAN:  A 77 year old gentleman with no significant past medical history except for degenerative joint disease who now has a symptomatic expressive aphasia and short-term memory loss who has a 5 cm vasogenic edema corpus callosum mass with extension to the left parietal temporal area.   I discussed with Mr. Earwood and his son that the most likely diagnosis is glioblastoma multiforme according to the radiographic appearance.  The lack of CT finding on the chest, abdomen and pelvic ruled out metastatic disease to the brain.  However, we cannot definitively rule out  primary CNS lymphoma.  I discussed with Mr. Gabrielle and his son that I had discussion with Dr. Tyler Pita this morning.  Given the fact that primary CNS lymphoma and GBM are treated significant differently we recommend that the patient  be evaluated by Dr. Ellene Route for consideration of stereotactic brain biopsy of the extension away from the corpus callosum.  I discussed with them that if Dr. Ellene Route does not think that this biopsy is safe because of location that we will assume the radiographic appearance and treat it as such as if he has GBM.   I discussed with Mr. Oakley and his son about GBM.  This is a disease that has the highest cure rate with complete resection followed by radiation chemotherapy.  Because of the location of his tumor being in the corpus callosum it is not resectable.  Therefore, the role of chemoradiation therapy is palliative only.  I discussed with them that randomized studies in the past have shown combination chemoradiation therapy compared to radiation therapy alone has increased response length and improved survival.  However, the side effects of Temodar include but not limited to mucositis, cytopenia, risk of bleeding and infection, worsening fatigue, opportunistic infection or  bleeding as such.  I discussed with Mr. Gorter and his son that even though he is 71 years old he has good performance status.  I discussed with him that there are patients who want aggressive care to increase quality of life regardless of side effects.  On the other hand there are patients who value quality of life.  Mr. Chichester expressively said that he valued quality of life over quantity of life however he is willing to accept some side effects to see whether he can tolerate treatment.  I discussed with him the compromise to proceed with chemoradiation therapy.  If he has side effects that are severe then he can discontinue chemotherapy and continue radiation therapy alone.  After the patient has received chemoradiation therapy he will have followup by MRI of the brain.  If he has some response then the recommended plan would be Temodar maintenance therapy.     I discussed with Mr. Fishbaugh and his son that he has a very poor prognosis disease given his age.  In case he has cardiopulmonary arrest he had already decided to be DNR/DNI and given the poor prognosis I advised him to talk with all the 3 sons and make sure they are on the same page and understand his decision.   Followup:  He will see Dr. Ellene Route in the next few days and I will see the patient certainly in 1 week.  This appointment can be moved up or down depending on his radiation therapy with Dr. Tammi Klippel.    ______________________________ Nobie Putnam, M.D. HTH/MEDQ  D:  04/09/2011  T:  04/12/2011  Job:  [id number]

## 2011-04-15 ENCOUNTER — Other Ambulatory Visit: Payer: Self-pay | Admitting: Oncology

## 2011-04-15 ENCOUNTER — Encounter: Payer: Self-pay | Admitting: Oncology

## 2011-04-15 ENCOUNTER — Telehealth: Payer: Self-pay | Admitting: *Deleted

## 2011-04-15 DIAGNOSIS — C719 Malignant neoplasm of brain, unspecified: Secondary | ICD-10-CM

## 2011-04-15 NOTE — Telephone Encounter (Signed)
Message copied by [place] Pink on Wed Apr 15, 2011  2:51 PM ------      Message from: [place], [place]      Created: Wed Apr 15, 2011  2:20 PM      Regarding: RE: question       Yes, can start Tuesday.  I can remove staples next Friday during PUT visit.  This should be noted on the schedule.            Thanks.            MM                        ----- Message -----         From: Ova Freshwater, RN         Sent: 04/15/2011  11:16 AM           To: Oneita Hurt, MD      Subject: question                                                 Inpt floor RN called asking if pt may begin xrt next Tues if he still has staples in his scalp.  Pt had bx yesterday, is for d/c today but has no FU w/[facility] scheduled before xrt. Dr Verlee Rossetti instructions stated staples out 7-10 days.            [facility] RN

## 2011-04-15 NOTE — Telephone Encounter (Signed)
CALLED SON AND INFORMED HIM PT MAY START RADIATION TX 04/21/11, AND DR MANNING WILL REMOVE STAPLES 04/24/11. SON VERBALIZED UNDERSTANDING.

## 2011-04-15 NOTE — Progress Notes (Signed)
Discharge note:  Pt and son educated on discharge instructions and given discharge information.  Went over discharge summary together all questions answered.  PIV in left arm removed catheter intact.  Pt discharged via wheelchair with son.  Pt alert and oriented

## 2011-04-15 NOTE — Discharge Summary (Signed)
Physician Discharge Summary  Patient ID: Mark Kerr MRN: [id number] DOB/AGE: [birth year] 77 y.o.  Admit date: 04/14/2011 Discharge date: 04/15/2011  Admission Mark Kerr  Tumor  Discharge Diagnoses: Glioblastoma Multiforme Active Problems:  * No active hospital problems. *    Discharged Condition: fair  Hospital Course: Patient admitted for stereotactic Brain biopsy. Frozen section show necrosis with high grade tumor consistent with Glioblastoma Multiforme. Patient tolerated procedure well with no worsening of condition.  Consults: none  Significant Diagnostic Studies  Treatments: surgery: Stereotactic Brain biopsy, frameless  Discharge Exam: Blood pressure 126/60, pulse 53, temperature 97.2 F (36.2 C), temperature source Oral, resp. rate 13, height 5\' 3"  (1.6 m), weight 69.4 kg (153 lb), SpO2 93.00%. mild dysphasia, slow shuffling gait, no weakness grossly noted as on admission.  Disposition: Home or Self Care  Discharge Orders    Future Appointments: Provider: Department: Dept Phone: Center:   04/21/2011 10:55 AM [facility] [phone] None   04/22/2011 10:55 AM [facility] [phone] None   04/23/2011 10:55 AM [facility] [phone] None   04/24/2011 10:55 AM [facility] [phone] None   04/27/2011 10:55 AM [facility] [phone] None   04/28/2011 10:55 AM [facility] [phone] None   04/29/2011 10:55 AM [facility] [phone] None   05/04/2011 10:55 AM [facility] [phone] None   05/05/2011 10:55 AM [facility] [phone] None   05/06/2011 10:55 AM [facility] [phone] None   05/07/2011 10:55 AM [facility] [phone] None   05/08/2011 10:55 AM  [facility] [phone] None   05/11/2011 10:55 AM [facility] [phone] None   05/12/2011 10:55 AM [facility] [phone] None   05/13/2011 10:55 AM [facility] [phone] None   05/14/2011 10:55 AM [facility] [phone] None   05/15/2011 10:55 AM [facility] [phone] None   05/18/2011 10:55 AM [facility] [phone] None   05/19/2011 10:55 AM [facility] [phone] None   05/20/2011 10:55 AM [facility] [phone] None   05/21/2011 10:55 AM [facility] [phone] None   05/22/2011 10:55 AM [facility] [phone] None   05/25/2011 10:55 AM [facility] [phone] None   05/26/2011 10:55 AM [facility] [phone] None   05/27/2011 10:55 AM [facility] 401 199 5283 None   05/28/2011 10:55 AM [facility] 401 199 5283 None   2011/06/28 10:55 AM [facility] 401 199 5283 None   06/01/2011 10:55 AM [facility] 401 199 5283 None   06/03/2011 10:55 AM [facility] 401 199 5283 None   06/04/2011 10:55 AM [facility] 401 199 5283 None     Future Orders Please Complete By Expires   Diet - low sodium heart healthy      Increase activity slowly      Discharge instructions      May shower / Bathe      Walk with assistance      Driving Restrictions      Comments:   No driving   Other Restrictions      Comments:   No vigorous activity   Remove dressing  in 48 hours      Call MD for:  temperature >100.4      Call MD for:  persistant nausea and vomiting      Call MD for:   redness, tenderness, or signs of infection (pain, swelling, redness, odor or green/yellow discharge around incision site)      Call MD for:  persistant dizziness or light-headedness        Current Discharge Medication List    CONTINUE these medications which have NOT CHANGED   Details  aspirin EC 81 MG tablet Take 81 mg by mouth daily. Twice a week (patient states that he doesn't have a particular day he just takes them when he thinks about it)     dexamethasone (DECADRON) 6 MG tablet Take 6 mg by mouth 3 (three) times daily.      famotidine (PEPCID) 20 MG tablet Take 20 mg by mouth 2 (two) times daily.      fish oil-omega-3 fatty acids 1000 MG capsule Take 1 g by mouth 4 (four) times daily.      Multiple Vitamins-Minerals (MULTIVITAMINS THER. W/MINERALS) TABS Take 1 tablet by mouth daily.      Saw Palmetto, Serenoa repens, (SAW PALMETTO PO) Take 1 capsule by mouth daily. Hold while in hospital        Follow-up Information    Follow up with [name] Call in 7 days. (staple removal in 7 -10 days)    Contact information:   [address] [place] [phone]          Signed: Earleen Newport 04/15/2011, 8:10 AM

## 2011-04-16 ENCOUNTER — Telehealth: Payer: Self-pay | Admitting: Oncology

## 2011-04-16 NOTE — Telephone Encounter (Signed)
S/w the pt's son patrick regarding the pt has an appt on 04/21/2011@9 :30am

## 2011-04-20 ENCOUNTER — Encounter (HOSPITAL_COMMUNITY): Payer: Self-pay | Admitting: Neurological Surgery

## 2011-04-21 ENCOUNTER — Ambulatory Visit (HOSPITAL_BASED_OUTPATIENT_CLINIC_OR_DEPARTMENT_OTHER): Payer: Medicare Other | Admitting: Oncology

## 2011-04-21 ENCOUNTER — Other Ambulatory Visit (HOSPITAL_BASED_OUTPATIENT_CLINIC_OR_DEPARTMENT_OTHER): Payer: Medicare Other

## 2011-04-21 ENCOUNTER — Ambulatory Visit
Admission: RE | Admit: 2011-04-21 | Discharge: 2011-04-21 | Disposition: A | Discharge status: Home / Self Care | Payer: Medicare Other | Source: Ambulatory Visit | Attending: Radiation Oncology | Admitting: Radiation Oncology

## 2011-04-21 VITALS — BP 136/84 | HR 65 | Temp 97.3°F | Ht 63.5 in | Wt 149.9 lb

## 2011-04-21 DIAGNOSIS — C719 Malignant neoplasm of brain, unspecified: Secondary | ICD-10-CM

## 2011-04-21 LAB — COMPREHENSIVE METABOLIC PANEL
ALT: 54 U/L — ABNORMAL HIGH (ref 0–53)
AST: 26 U/L (ref 0–37)
Alkaline Phosphatase: 77 U/L (ref 39–117)
BUN: 22 mg/dL (ref 6–23)
Creatinine, Ser: 0.6 mg/dL (ref 0.50–1.35)
Total Bilirubin: 0.6 mg/dL (ref 0.3–1.2)

## 2011-04-21 LAB — CBC WITH DIFFERENTIAL/PLATELET
BASO%: 0.3 % (ref 0.0–2.0)
Basophils Absolute: 0.1 10*3/uL (ref 0.0–0.1)
EOS%: 0 % (ref 0.0–7.0)
HCT: 44.3 % (ref 38.4–49.9)
HGB: 15.5 g/dL (ref 13.0–17.1)
LYMPH%: 7 % — ABNORMAL LOW (ref 14.0–49.0)
MCH: 31.8 pg (ref 27.2–33.4)
MCHC: 35 g/dL (ref 32.0–36.0)
MONO#: 1 10*3/uL — ABNORMAL HIGH (ref 0.1–0.9)
NEUT%: 86.9 % — ABNORMAL HIGH (ref 39.0–75.0)
Platelets: 126 10*3/uL — ABNORMAL LOW (ref 140–400)
lymph#: 1.2 10*3/uL (ref 0.9–3.3)

## 2011-04-21 NOTE — Progress Notes (Signed)
Post sim done w/pt and 2 family members. Pt given "Radiation and You" booklet. Pt advised to alert nursing if he experiences any sudden changes re: n/v, headaches, pain, vision changes or other symptoms that are new. Pt and family verbalized understanding; had no questions.

## 2011-04-21 NOTE — Progress Notes (Signed)
[facility] OFFICE PROGRESS NOTE  DIAGNOSIS:   Glioblastoma multiforme.   CURRENT THERAPY:  Due to start radiation therapy today.  INTERVAL HISTORY: Mark Kerr 77 y.o. male returns for final discussion for the role of chemotherapy concurrently with radiation for his GBM.  He is here with his son.  He reports that he is still able to take care of himself.  His expressive aphasia is stable.  He is still taking Decadron without problem with feeling wired, confused, insomnia.  He denies head ache, seizure.  He has some right leg weakness since the brain tumor diagnosis; however, he is still able to ambulate without fall.    MEDICAL HISTORY: Past Medical History  Diagnosis Date  . Glioblastoma multiforme 03/2011  . Degenerative joint disease involving multiple joints     ALLERGIES:   has no known allergies.  MEDICATIONS: Current Outpatient Prescriptions  Medication Sig Dispense Refill  . aspirin EC 81 MG tablet Take 81 mg by mouth daily. Twice a week (patient states that he doesn't have a particular day he just takes them when he thinks about it)       . dexamethasone (DECADRON) 6 MG tablet Take 6 mg by mouth 3 (three) times daily.        . famotidine (PEPCID) 20 MG tablet Take 20 mg by mouth 2 (two) times daily.        . fish oil-omega-3 fatty acids 1000 MG capsule Take 1 g by mouth 4 (four) times daily.        . Multiple Vitamins-Minerals (MULTIVITAMINS THER. W/MINERALS) TABS Take 1 tablet by mouth daily.        . Saw Palmetto, Serenoa repens, (SAW PALMETTO PO) Take 1 capsule by mouth daily. Hold while in hospital         SURGICAL HISTORY:  Past Surgical History  Procedure Date  . Joint replacement     bil  [id number]   . Cardiac catheterization     1992  . Back surgery 3 YRS  . Steriotactic stimulator insertion 04/14/2011    Procedure: STERIOTACTIC BIOPSY;  Surgeon: Earleen Newport;  Location: [facility];  Service: Neurosurgery;  Laterality: N/A;  Brain Biopsy  with Stealth Prooacal    REVIEW OF SYSTEMS:  Pertinent items are noted in HPI.   Filed Vitals:   04/21/11 1001  BP: 136/84  Pulse: 65  Temp: 97.3 F (36.3 C)   Wt Readings from Last 3 Encounters:  04/21/11 149 lb 14.4 oz (67.994 kg)  04/14/11 153 lb (69.4 kg)  04/14/11 153 lb (69.4 kg)  ECOG 1.  PHYSICAL EXAMINATION:   General: well-nourished in no acute distress. Eyes: no scleral icterus. ENT: There were no oropharyngeal lesions. Neck was without thyromegaly. Lymphatics: Negative cervical, supraclavicular or axillary adenopathy. Respiratory: lungs were clear bilaterally without wheezing or crackles. Cardiovascular: Regular rate and rhythm, S1/S2, without murmur, rub or gallop. There was no pedal edema. GI: abdomen was soft, flat, nontender, nondistended, without organomegaly. Muscoloskeletal: no spinal tenderness of palpation of vertebral spine. Skin exam was without echymosis, petichae. Patient needed minor assistance to get on and off exam table.  Gait was wobly. Patient was alerted and oriented. Attention was good. Language was appropriate. Mood was normal without depression. Speech was not pressured. Thought content was not tangential. Neurological: Alert and oriented times 4. He does have problem with expressive aphasia. He was able to follow conversation very well and ask very appropriate questions about prognosis and the need  for workup and biopsy. Cranial nerves II-XII grossly intact. His motor strength was 4/5 right upper/lower extremity and 5/5 in the left.  His deep tendon reflex was 2+ bilaterally upper and lower extremities.     LABORATORY/RADIOLOGY DATA:  Lab Results  Component Value Date   WBC 16.2* 04/14/2011   HGB 15.5 04/21/2011   HCT 44.3 04/21/2011   PLT 126* 04/21/2011   GLUCOSE 88 04/14/2011   ALT 50 04/09/2011   ALT 50 04/09/2011   AST 23 04/09/2011   AST 23 04/09/2011   NA 137 04/14/2011   K 4.6 04/14/2011   CL 100 04/14/2011   CREATININE 0.64 04/14/2011   BUN  19 04/14/2011   CO2 31 04/14/2011   INR 1.02 04/14/2011    PATHOLOGY:  SP-Surgical Pathology - STATUS: Final  .                                         Perform Date: 6 Nov12 00:01  Ordered By: Nigel Berthold,          Ordered Date:  Facility: [facility]                              Department: [place]  Service Report Text  [facility]   [address]   [place] , [place] - [address]   Telephone : [phone] Fax : [phone]    REPORT OF SURGICAL PATHOLOGY    Accession #: [id number]   Patient Name: Mark Kerr, Mark Kerr   Visit # : [id number]    MRN: [id number]   Physician: Kristeen Miss   DOB/Age [birth year] (Age: 27) Gender: M   Collected Date: 04/14/2011   Received Date: 04/14/2011    FINAL DIAGNOSIS    1. Brain, for tumor resection, :   - GLIOBLASTOMA (WHO GRADE IV OF IV), SEE COMMENT.    2. Brain, for tumor resection, :   - GLIOBLASTOMA (WHO GRADE IV OF IV), SEE COMMENT.  ASSESSMENT AND PLAN:  GBM involving corpus callosum:  He is not a candidate for curative resection.  We went over the pathology report.  I discussed with patient and his son in detail that concurrent chemoradiation in this case is palliative.  The role of chemotherapy Tamodar is for radiation sensitization.  I discussed with [name] and his son that side effects of Temodar include but not limited to fatigue, mucositis, nausea, vomiting, skin rash, cytopenia, opportunistic infection such as reactivated herpes or PCP.  I discussed with him that randomized trials have shown response rate and survival rate improvement using chemotherapy as supposed radiation therapy alone.  [name] and his son were informed that he  probably has one shot at controlling this illness. If he were to have recurrence, it will be difficult to addressed. Prior to the visit today, patient had his son had already made up his mind that he did not want chemotherapy and instead just wanted to proceed with radiation along.  I  offered him my support and availability in case he needs our help in the future. Patient was discharged from Medical Oncology clinic today.   He will follow up with Dr. Tammi Klippel from [facility] alone.

## 2011-04-22 ENCOUNTER — Ambulatory Visit
Admission: RE | Admit: 2011-04-22 | Discharge: 2011-04-22 | Disposition: A | Discharge status: Home / Self Care | Payer: Medicare Other | Source: Ambulatory Visit | Attending: Radiation Oncology | Admitting: Radiation Oncology

## 2011-04-23 ENCOUNTER — Ambulatory Visit
Admission: RE | Admit: 2011-04-23 | Discharge: 2011-04-23 | Disposition: A | Discharge status: Home / Self Care | Payer: Medicare Other | Source: Ambulatory Visit | Attending: Radiation Oncology | Admitting: Radiation Oncology

## 2011-04-24 ENCOUNTER — Ambulatory Visit
Admission: RE | Admit: 2011-04-24 | Discharge: 2011-04-24 | Disposition: A | Discharge status: Home / Self Care | Payer: Medicare Other | Source: Ambulatory Visit | Attending: Radiation Oncology | Admitting: Radiation Oncology

## 2011-04-24 ENCOUNTER — Encounter: Payer: Self-pay | Admitting: Radiation Oncology

## 2011-04-24 VITALS — Wt 151.0 lb

## 2011-04-24 DIAGNOSIS — C719 Malignant neoplasm of brain, unspecified: Secondary | ICD-10-CM

## 2011-04-24 NOTE — Progress Notes (Signed)
Pt reports sleeping well , appetite good, denies headache/pain, vision changes. Pt states he wants to begin swimming again; has done so often in past. pt still has staples in scalp. States he has no scheduled fu w/surgeon.  No skin changes noted in tx area.  Decadron 6 mg tid.

## 2011-04-24 NOTE — Progress Notes (Signed)
  Radiation Oncology         [phone]  Weekly Radiation Therapy Management  Current Dose: 8 Gy     Planned Dose:  60 Gy  Narrative . . . . . . . . The patient presents for routine under treatment assessment.                                                                                      The patient is without complaint. He has decided against chemotherapy.                                 Set-up films were reviewed.                                 The chart was checked.                                 Dexamethasone 4 TID Physical Findings. . . Weight essentially stable. The patient does have a shaved area on the left frontal scalp.                                  His incision shows no infection or drainage. His staples were removed uneventfully Impression . . . . . . . The patient is  tolerating radiation. Plan . . . . . . . . . . . . Continue treatment as planned.  _____________________________________  Mark Kerr. Kathrynn Running, M.D. Radiation Oncology [phone]

## 2011-04-25 ENCOUNTER — Ambulatory Visit
Admission: RE | Admit: 2011-04-25 | Discharge: 2011-04-25 | Disposition: A | Discharge status: Home / Self Care | Payer: Medicare Other | Source: Ambulatory Visit | Attending: Radiation Oncology | Admitting: Radiation Oncology

## 2011-04-27 ENCOUNTER — Encounter: Payer: Self-pay | Admitting: Radiation Oncology

## 2011-04-27 ENCOUNTER — Ambulatory Visit
Admission: RE | Admit: 2011-04-27 | Discharge: 2011-04-27 | Disposition: A | Discharge status: Home / Self Care | Payer: Medicare Other | Source: Ambulatory Visit | Attending: Radiation Oncology | Admitting: Radiation Oncology

## 2011-04-27 VITALS — Wt 150.0 lb

## 2011-04-27 DIAGNOSIS — C719 Malignant neoplasm of brain, unspecified: Secondary | ICD-10-CM | POA: Insufficient documentation

## 2011-04-27 NOTE — Progress Notes (Signed)
  Radiation Oncology         [phone] ________________________________  Name: Mark Kerr MRN: [id number]  Date: 04/27/2011  DOB: [birth year]  Weekly Radiation Therapy Management  Current Dose: 12 Gy     Planned Dose:  60 Gy  Narrative . . . . . . . . The patient presents for routine under treatment assessment.                                                     The patient is without complaint.  He is continuing to suffer with fatigue, lethargy, and decreased performance status. He is frustrated by this. He denies headaches nausea vomiting.                                 Set-up films were reviewed.                                 The chart was checked. Physical Findings. . . Weight essentially stable.  No significant changes. Impression . . . . . . . The patient is  tolerating radiation. Plan . . . . . . . . . . . . Continue treatment as planned. I will explore the possibility of accelerating this patient's course of radiotherapy using larger daily doses of hypofractionated approach.  ________________________________  Artist Pais Kathrynn Running, M.D.

## 2011-04-27 NOTE — Progress Notes (Signed)
Pt denies nausea, headaches, pain, sleeps well, has good appetite, does c/o dizziness. States "I just don't like feeling this way. I'm used to feeling good. I';m so independent." Pt asking "when can I expect to feel better?"  Cont to take Decadron 6 mg tid.

## 2011-04-28 ENCOUNTER — Ambulatory Visit
Admission: RE | Admit: 2011-04-28 | Discharge: 2011-04-28 | Disposition: A | Discharge status: Home / Self Care | Payer: Medicare Other | Source: Ambulatory Visit | Attending: Radiation Oncology | Admitting: Radiation Oncology

## 2011-04-29 ENCOUNTER — Ambulatory Visit
Admission: RE | Admit: 2011-04-29 | Discharge: 2011-04-29 | Disposition: A | Discharge status: Home / Self Care | Payer: Medicare Other | Source: Ambulatory Visit | Attending: Radiation Oncology | Admitting: Radiation Oncology

## 2011-04-29 DIAGNOSIS — C719 Malignant neoplasm of brain, unspecified: Secondary | ICD-10-CM

## 2011-04-29 LAB — CBC WITH DIFFERENTIAL/PLATELET
Basophils Absolute: 0 10*3/uL (ref 0.0–0.1)
Eosinophils Absolute: 0 10*3/uL (ref 0.0–0.5)
HGB: 15.1 g/dL (ref 13.0–17.1)
MCV: 95.8 fL (ref 79.3–98.0)
MONO%: 3.7 % (ref 0.0–14.0)
NEUT#: 12.8 10*3/uL — ABNORMAL HIGH (ref 1.5–6.5)
RDW: 14.8 % — ABNORMAL HIGH (ref 11.0–14.6)

## 2011-04-29 LAB — BASIC METABOLIC PANEL
CO2: 25 mEq/L (ref 19–32)
Calcium: 8.1 mg/dL — ABNORMAL LOW (ref 8.4–10.5)
Glucose, Bld: 70 mg/dL (ref 70–99)
Potassium: 4.2 mEq/L (ref 3.5–5.3)
Sodium: 135 mEq/L (ref 135–145)

## 2011-04-29 NOTE — Progress Notes (Signed)
Pt's son requests to see dr today re: he has observed increased blurred vision and confusion in pt this week. Pt has had chair moved 5 ft closer to tv, asked for new reading glasses. Son states pt exhibiting signs of more confusion. Decadron 6 mg tid which is ongoing dose for pt. Pt denies nausea, headaches, pain. Pt has slow, sometimes unsteady gait but this is unchanged from previous week. Dr Roselind Messier to see pt and son today.

## 2011-04-29 NOTE — Progress Notes (Signed)
DIAGNOSIS:  Glioblastoma multiforme.  NARRATIVE:  Mark Kerr is seen today at the request of his son.  Over the past few days, Mark Kerr has been noted to have more confusion and problems with his vision.  The patient is requiring to sit closer to watch TV.  The patient denies any obvious headaches or double vision. The patient continues on Decadron 6 mg three times daily.  Per discussion with his son, he has not missed any of his Decadron dosing.  PHYSICAL EXAMINATION:  Vital Signs:  The patient's weight today is 148.5 pounds.  General Appearance:  The patient does have some confusion and expressive aphasia.  HEENT:  Pupils are equal, round, and reactive to light.  The extraocular eye movements are intact.  The patient has some mild right-sided weakness.  Examination of the oral cavity reveals no obvious candidal infection.  Lungs:  Clear.  Heart:  Regular rhythm and rate.  IMPRESSION AND PLAN:  Glioblastoma receiving radiation therapy.  I am unsure of the etiology of the patient's progressive confusion and blurred vision.  It could be that the patient's Glioblastoma multiforme is progressing.  The patient will be scheduled for routine blood work today.  Also, consideration for a repeat head scan may be indicated if the patient's symptoms worsen.    ______________________________ Blair Promise, Ph.D., M.D. JDK/MEDQ  D:  04/29/2011  T:  04/29/2011  Job:  [id number]

## 2011-05-04 ENCOUNTER — Ambulatory Visit
Admission: RE | Admit: 2011-05-04 | Discharge: 2011-05-04 | Disposition: A | Discharge status: Home / Self Care | Payer: Medicare Other | Source: Ambulatory Visit | Attending: Radiation Oncology | Admitting: Radiation Oncology

## 2011-05-05 ENCOUNTER — Ambulatory Visit
Admission: RE | Admit: 2011-05-05 | Discharge: 2011-05-05 | Disposition: A | Discharge status: Home / Self Care | Payer: Medicare Other | Source: Ambulatory Visit | Attending: Radiation Oncology | Admitting: Radiation Oncology

## 2011-05-06 ENCOUNTER — Ambulatory Visit
Admission: RE | Admit: 2011-05-06 | Discharge: 2011-05-06 | Disposition: A | Discharge status: Home / Self Care | Payer: Medicare Other | Source: Ambulatory Visit | Attending: Radiation Oncology | Admitting: Radiation Oncology

## 2011-05-07 ENCOUNTER — Ambulatory Visit
Admission: RE | Admit: 2011-05-07 | Discharge: 2011-05-07 | Disposition: A | Discharge status: Home / Self Care | Payer: Medicare Other | Source: Ambulatory Visit | Attending: Radiation Oncology | Admitting: Radiation Oncology

## 2011-05-07 DIAGNOSIS — C719 Malignant neoplasm of brain, unspecified: Secondary | ICD-10-CM

## 2011-05-07 NOTE — Progress Notes (Signed)
Pt denies headaches, pain, nausea; states he has good appetite, sleeps well.  Son states he has noticed pt's r hand trembling more often.  Will talk w/dr about boost tx.

## 2011-05-07 NOTE — Progress Notes (Signed)
[facility] Radiation Oncology Weekly Treatment Note    Name: Mark Kerr Date: 05/07/2011 MRN: [id number] DOB: Jun 08, 1931  Status:outpatient    Current dose: 2600  Current fraction:12  Planned dose:4400  Planned fraction:18   MEDICATIONS: Current Outpatient Prescriptions  Medication Sig Dispense Refill  . aspirin EC 81 MG tablet Take 81 mg by mouth daily. Twice a week (patient states that he doesn't have a particular day he just takes them when he thinks about it)       . dexamethasone (DECADRON) 6 MG tablet Take 6 mg by mouth 3 (three) times daily.        . diclofenac (VOLTAREN) 50 MG EC tablet       . famotidine (PEPCID) 20 MG tablet Take 20 mg by mouth 2 (two) times daily.        . fish oil-omega-3 fatty acids 1000 MG capsule Take 1 g by mouth 4 (four) times daily.        . Multiple Vitamins-Minerals (MULTIVITAMINS THER. W/MINERALS) TABS Take 1 tablet by mouth daily.        . Saw Palmetto, Serenoa repens, (SAW PALMETTO PO) Take 1 capsule by mouth daily. Hold while in hospital          ALLERGIES: Review of patient's allergies indicates no known allergies.   LABORATORY DATA:  Lab Results  Component Value Date   WBC 14.4* 04/29/2011   HGB 15.1 04/29/2011   HCT 44.4 04/29/2011   MCV 95.8 04/29/2011   PLT 143 04/29/2011   Lab Results  Component Value Date   NA 135 04/29/2011   K 4.2 04/29/2011   CL 101 04/29/2011   CO2 25 04/29/2011   Lab Results  Component Value Date   ALT 54* 04/21/2011   AST 26 04/21/2011   ALKPHOS 77 04/21/2011   BILITOT 0.6 04/21/2011      NARRATIVE: Marrion Coy was seen today for weekly treatment management. The chart was checked and MVCT images were reviewed. Pt doing fairly well. No headaches, no nausea. Somed developing alopecia. Son noticed some trembling of arm a little.  PHYSICAL EXAMINATION: weight is 149 lb 4.8 oz (67.722 kg).     Thrush present.   ASSESSMENT: Patient tolerating treatments well.    PLAN:  Continue treatment as planned. Gave nystatin script.

## 2011-05-08 ENCOUNTER — Ambulatory Visit
Admission: RE | Admit: 2011-05-08 | Discharge: 2011-05-08 | Disposition: A | Discharge status: Home / Self Care | Payer: Medicare Other | Source: Ambulatory Visit | Attending: Radiation Oncology | Admitting: Radiation Oncology

## 2011-05-09 ENCOUNTER — Ambulatory Visit: Payer: Self-pay | Admitting: Internal Medicine

## 2011-05-11 ENCOUNTER — Encounter: Payer: Self-pay | Admitting: Radiation Oncology

## 2011-05-11 ENCOUNTER — Ambulatory Visit
Admission: RE | Admit: 2011-05-11 | Discharge: 2011-05-11 | Disposition: A | Discharge status: Home / Self Care | Payer: Medicare Other | Source: Ambulatory Visit | Attending: Radiation Oncology | Admitting: Radiation Oncology

## 2011-05-11 ENCOUNTER — Telehealth: Payer: Self-pay | Admitting: *Deleted

## 2011-05-11 ENCOUNTER — Telehealth: Payer: Self-pay

## 2011-05-11 DIAGNOSIS — C719 Malignant neoplasm of brain, unspecified: Secondary | ICD-10-CM

## 2011-05-11 DIAGNOSIS — G4700 Insomnia, unspecified: Secondary | ICD-10-CM

## 2011-05-11 MED ORDER — ESZOPICLONE 2 MG PO TABS: 2.0000 mg | ORAL_TABLET | Freq: Every day | ORAL | Status: AC

## 2011-05-11 NOTE — Progress Notes (Addendum)
  Radiation Oncology         [phone] ________________________________  Name: Mark Kerr MRN: [id number]  Date: 05/11/2011  DOB: 10/18/1930   Son states pt waking q2 hrs during night, had been sleeping approx 6 hrs straight. Pt is not on any sleep aids or anti-anxiety meds. Pt's pharmacy [facility], [place], [phone]. Inquiring about physical therapy for pt due to progressive weakness. He states pt was swimming 4 days/week before dx. Son asking if speech therapy would be helpful.   Added Lunesta and requested PT eval at [facility]  Impression . . . . . . . The patient is  tolerating radiation. Plan . . . . . . . . . . . . Continue treatment as planned.  ________________________________  Artist Pais. Kathrynn Running, M.D.

## 2011-05-12 ENCOUNTER — Ambulatory Visit
Admission: RE | Admit: 2011-05-12 | Discharge: 2011-05-12 | Disposition: A | Discharge status: Home / Self Care | Payer: Medicare Other | Source: Ambulatory Visit | Attending: Radiation Oncology | Admitting: Radiation Oncology

## 2011-05-12 ENCOUNTER — Emergency Department: Payer: Self-pay | Admitting: Emergency Medicine

## 2011-05-13 ENCOUNTER — Telehealth: Payer: Self-pay | Admitting: *Deleted

## 2011-05-13 ENCOUNTER — Ambulatory Visit: Payer: Medicare Other

## 2011-05-13 NOTE — Telephone Encounter (Signed)
Son called stating pt fell last night, hit head on side of tub. Pt taken to [facility] ED where he had CT scan of head done; per son pt has no trauma or swelling of head. Pt received 8-10 stitches in head. Son gave pt Lunesta at bedtime, for first time, states pt "rolled out of bed, was more confused, talked in his sleep which he's not done before". Pt asleep at this time. Son asking if pt should receive radiation tx today; tx time 10:55am. Advised son will relay message to Dr Kathrynn Running who is in [facility]; will call son back w/response. Son verbalized understanding. Sent Dr Kathrynn Running email per Outlook.

## 2011-05-13 NOTE — Telephone Encounter (Signed)
Spoke w/Dr Kathrynn Running and informed him of son's phone call this morning.  Per Dr Kathrynn Running, pt on tx break today, son to hold Lunesta tonight, pt to see dr before radiation tx tomorrow. Called son and advised of dr's instructions. Son will bring pt in 20-30 min early to see dr before tx on 05/14/11.  Son states pt still drowsy; was given Lunesta 10:30pm last night. Also advised Luisa Hart to take pt back to ED today if he remains drowsy and/or is inarrousable, develops nausea/vomiting, c/o headaches, pain.  Son verbalized understanding of all instructions.

## 2011-05-14 ENCOUNTER — Ambulatory Visit
Admission: RE | Admit: 2011-05-14 | Discharge: 2011-05-14 | Disposition: A | Discharge status: Home / Self Care | Payer: Medicare Other | Source: Ambulatory Visit | Attending: Radiation Oncology | Admitting: Radiation Oncology

## 2011-05-14 DIAGNOSIS — R4182 Altered mental status, unspecified: Secondary | ICD-10-CM

## 2011-05-14 NOTE — Progress Notes (Addendum)
  Radiation Oncology         [phone] ________________________________  Name: Mark Kerr: [id number]  Date: 05/14/2011  DOB: [birth year]  Weekly Radiation Therapy Management  Narrative . . . . . . . . The patient presents to discuss your recent fall prior to receiving treatment..                                 Set-up films were reviewed.                                 The chart was checked. Physical Findings. . . the patient is a laceration on his forehead. He remains lethargic but answers questions appropriately  Weight essentially stable.  No significant changes. Impression . . . . . . . The patient is  tolerating radiation. his declining performance status may be related to his primary tumor. It may also be related to some superimposed problem related to steroid use such as a urinary tract infection.  Plan . . . . . . . . . . . . Continue treatment as planned.Today, we'll check a urinalysis and urine culture. The patient has 3 more treatments planned.   ________________________________  Artist Pais. Kathrynn Running, M.D. .

## 2011-05-15 ENCOUNTER — Ambulatory Visit
Admission: RE | Admit: 2011-05-15 | Discharge: 2011-05-15 | Disposition: A | Discharge status: Home / Self Care | Payer: Medicare Other | Source: Ambulatory Visit | Attending: Radiation Oncology | Admitting: Radiation Oncology

## 2011-05-15 ENCOUNTER — Telehealth: Payer: Self-pay | Admitting: *Deleted

## 2011-05-15 ENCOUNTER — Ambulatory Visit: Payer: Medicare Other

## 2011-05-15 DIAGNOSIS — R4182 Altered mental status, unspecified: Secondary | ICD-10-CM

## 2011-05-15 DIAGNOSIS — C719 Malignant neoplasm of brain, unspecified: Secondary | ICD-10-CM | POA: Insufficient documentation

## 2011-05-15 LAB — URINALYSIS, MICROSCOPIC - CHCC
Blood: NEGATIVE
Nitrite: NEGATIVE
Protein: 30 mg/dL
Specific Gravity, Urine: 1.025 (ref 1.003–1.035)
pH: 6 (ref 4.6–8.0)

## 2011-05-15 NOTE — Telephone Encounter (Signed)
Called pt's son and informed him UA results are normal. Son verbalized understanding.

## 2011-05-15 NOTE — Telephone Encounter (Signed)
Message copied by [place] Pink on Fri May 15, 2011  1:13 PM ------      Message from: [place], [place]      Created: Fri May 15, 2011 12:57 PM       Please call patient with normal result.            Thanks. MM

## 2011-05-15 NOTE — Telephone Encounter (Signed)
Pt did not obtain urine specimen yesterday as ordered. Dr Kathrynn Running will order UA today. Called Luisa Hart, pt's son and informed him pt to go to lab after tx today. Requested son assist pt to obtain urine specimen today. Son verbalized understanding.

## 2011-05-15 NOTE — Progress Notes (Signed)
Quick Note:  Please call patient with normal result.  Thanks. MM ______ 

## 2011-05-17 LAB — URINE CULTURE

## 2011-05-18 ENCOUNTER — Ambulatory Visit: Payer: Medicare Other

## 2011-05-18 ENCOUNTER — Ambulatory Visit
Admission: RE | Admit: 2011-05-18 | Discharge: 2011-05-18 | Disposition: A | Discharge status: Home / Self Care | Payer: Medicare Other | Source: Ambulatory Visit | Attending: Radiation Oncology | Admitting: Radiation Oncology

## 2011-05-18 ENCOUNTER — Inpatient Hospital Stay: Admission: RE | Admit: 2011-05-18 | Payer: Medicare Other | Source: Ambulatory Visit | Admitting: Radiation Oncology

## 2011-05-18 ENCOUNTER — Encounter: Payer: Self-pay | Admitting: *Deleted

## 2011-05-18 ENCOUNTER — Other Ambulatory Visit: Payer: Self-pay | Admitting: Radiation Oncology

## 2011-05-18 DIAGNOSIS — N390 Urinary tract infection, site not specified: Secondary | ICD-10-CM

## 2011-05-18 MED ORDER — CEPHALEXIN 250 MG PO CAPS: 250.0000 mg | ORAL_CAPSULE | Freq: Four times a day (QID) | ORAL | Status: AC

## 2011-05-18 NOTE — Progress Notes (Signed)
Spoke w/pt's son while pt receiving final radiation tx. Informed him that due to UA culture results, Dr Kathrynn Running has ordered Keflex for pt; can be picked up at pts' pharmacy. Son verbalized understanding. Son states pt had "some blood on tissue when he blew his nose". Son denies overt  bleeding, states it "was just on the tissue". Pt is not on any blood thinners or ASA daily. Advised son that if his father should have nose bleed that cannot be controlled or repeated nosebleeds, he should take him to Urgent Care immediately. All questions answered; advised son to call before FU if he has any questions, concerns. Pt has 1 month FU w/Dr Kathrynn Running.

## 2011-05-19 ENCOUNTER — Ambulatory Visit: Payer: Medicare Other

## 2011-05-20 ENCOUNTER — Telehealth: Payer: Self-pay | Admitting: *Deleted

## 2011-05-20 ENCOUNTER — Ambulatory Visit: Payer: Medicare Other

## 2011-05-20 DIAGNOSIS — C719 Malignant neoplasm of brain, unspecified: Secondary | ICD-10-CM

## 2011-05-20 NOTE — Telephone Encounter (Addendum)
Returned call from [place], pt's son who states he called Dr Waynard Edwards, PCP yesterday due to his father's increased confusion, agitation, inability to sleep at night. He states pt does sleep during day but has periods of "fidgeting, being confused as to place, repeating himself". Has not given pt Lunesta due to previous fall after pt was given [place] first time.  He states PCP may call med in for pt, but Luisa Hart wants Dr Broadus John opinion of pt's increasing confusion, agitation and what med is best to help pt relax so he can rest. Advised will give this message to Dr Kathrynn Running and call him back.  Spoke w/Amanda RN, Dr Perini's office who confirms Ativan 0.5 mg, take 1/2 - 1 tab up to TID prn has been called to pt's pharmacy.

## 2011-05-20 NOTE — Telephone Encounter (Signed)
Mark Kerr, please let Luisa Hart know that I am supportive of using Ativan in this setting.  I am hopeful that the patient's confusion and agitation will improve with completion of antibiotics for UTI

## 2011-05-20 NOTE — Telephone Encounter (Signed)
Called pt's son and informed him Dr Waynard Edwards ordered Ativan for pt nurse called med in to pt's pharmacy and that pt may begin with 1/2 tab. Advised that Dr Waynard Edwards would only order med that was safe for pt to take. Advised the med will make pt sleepy but is given to relax pts. Told son will call him back tomorrow if Dr Kathrynn Running has any instructions/information. Son understands this RN leaving today at 4pm and will not be able to call him this afternoon w/dr's reply.

## 2011-05-21 ENCOUNTER — Ambulatory Visit: Payer: Medicare Other

## 2011-05-21 ENCOUNTER — Telehealth: Payer: Self-pay | Admitting: *Deleted

## 2011-05-21 NOTE — Telephone Encounter (Signed)
Spoke w/Patrick who states he is unsure if pt received Ativan last night; his brother stayed w/pt and Luisa Hart will call him today to inquire. Pt did receive Ativan 0.5 mg @ 7:30 am this morning, and Luisa Hart states "it is helping". Luisa Hart will stay w/pt tonight and states he will call tomorrow if "his father has any problems tonight".

## 2011-05-22 ENCOUNTER — Ambulatory Visit: Payer: Medicare Other

## 2011-05-22 NOTE — Telephone Encounter (Signed)
error 

## 2011-05-25 ENCOUNTER — Ambulatory Visit: Payer: Medicare Other

## 2011-05-26 ENCOUNTER — Ambulatory Visit: Payer: Medicare Other

## 2011-05-26 ENCOUNTER — Inpatient Hospital Stay: Payer: Self-pay | Admitting: Internal Medicine

## 2011-05-27 ENCOUNTER — Ambulatory Visit: Payer: Medicare Other

## 2011-05-28 ENCOUNTER — Ambulatory Visit: Payer: Medicare Other

## 2011-05-29 ENCOUNTER — Ambulatory Visit: Payer: Medicare Other

## 2011-06-01 ENCOUNTER — Ambulatory Visit: Payer: Medicare Other

## 2011-06-03 ENCOUNTER — Ambulatory Visit: Payer: Medicare Other

## 2011-06-09 ENCOUNTER — Encounter: Payer: Self-pay | Admitting: Radiation Oncology

## 2011-06-09 ENCOUNTER — Ambulatory Visit: Payer: Self-pay | Admitting: Internal Medicine

## 2011-06-09 DEATH — deceased

## 2011-06-11 NOTE — Progress Notes (Signed)
CC:   Mark Kerr, M.D. Mark Kerr, M.D. Mark Kerr, M.D.  REFERRING PHYSICIAN:  Mark A. Kerr, M.D.  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  HISTORY OF PRESENT ILLNESS:  Mark Kerr is very nice 78 year old gentleman from [place], [place].  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  He began experiencing expressive aphasia.  He has also had some difficulty processing information.  Head CT on 10/14 demonstrated a 3 x 5 cm frontal mass with involvement of the corpus callosum with significant vasogenic edema.  Subsequent brain MRI on 10/15 showed a 5 cm irregular mass with some internal hemorrhage crossing the corpus callosum. Glioblastoma was suspected.  CNS lymphoma was considered possible but less likely given the radiographic features.  The patient has kindly been referred today for discussion of potential radiation treatment options.  PAST MEDICAL HISTORY:  Degenerative joint disease.  PAST SURGICAL HISTORY: 1. Total knee replacement in 1991 and 2002. 2. Back surgery.  FAMILY HISTORY:  Noncontributory.  SOCIAL HISTORY:  The patient has been working as a Software engineer.  He is a widower.  He has 3 sons.  He denies tobacco or alcohol use.  ALLERGIES:  NKDA.  MEDICATIONS: 1. Aspirin 81 mg. 2. Dexamethasone 6 mg t.i.d. 3. Ergocalciferol. 4. Famotidine. 5. Saw palmetto.  REVIEW OF SYSTEMS:  A 15 point review of systems is documented in the radiotherapy record.  This is essentially noncontributory other than the neurologic symptoms described above.  PHYSICAL EXAMINATION:  The patient is a well-nourished, well-developed gentleman in no acute distress.  He is alert and oriented.  Speech is somewhat slow but deliberate and articulate.  Gait is slow but steady with good balance.  RADIOGRAPHIC FINDINGS:  The patient's CT and MRI were reviewed today.  IMPRESSION:  Mark Kerr is a very  nice 78 year old gentleman with a new enhancing brain mass suggestive of glioblastoma multiforme given the location and enhancement features.  The features are not suggestive of metastatic disease, however, I would suggest a CT study set of the chest, abdomen and pelvis to assess for possible primary tumor since statistically brain metastases are more common than GBM.  The patient was briefly hospitalized with Dr. Kristeen Miss during that time to discuss the potential for biopsy versus resection.  He is not felt to be resectable and at the time of his diagnosis did not wish to pursue biopsy.  PLAN:  Today I reviewed with Mark Kerr and his son the findings and workup thus far.  We talked about the role of radiotherapy in the management of brain tumors.  We talked about the implications of biopsy. We talked about the implications of CTs of the body.  At this point the patient is agreeable to proceeding with CTs of the body to assess for a possible primary tumor.  If these do not show another source for metastatic malignancy, the patient would like to consider brain biopsy prior to proceeding with definitive treatment.  He is interested in pursuing radiation therapy.  We talked about radiation treatment today. We reviewed the logistics and delivery of treatments as well as anticipated acute and late sequelae.  We spent more than 50% of our visit in patient counseling.  The patient will also meet with Dr. Sherryl Manges in medical oncology as we formulate a treatment plan.    ______________________________ Lora Paula, M.D. MAM/MEDQ  D:  06/09/2011  T:  06/09/2011  Job:  [id number] 

## 2011-06-17 NOTE — Telephone Encounter (Signed)
error 

## 2011-06-18 ENCOUNTER — Ambulatory Visit: Payer: Medicare Other | Admitting: Radiation Oncology

## 2011-07-31 NOTE — Telephone Encounter (Signed)
xxx

## 2012-02-02 IMAGING — CR DG CHEST 1V PORT
1 series · 1 of 1 positions shown · non-contrast
Comparison: none

REASON FOR EXAM: ams
COMMENTS:

PROCEDURE:     DXR - DXR PORTABLE CHEST SINGLE VIEW  - May 12, 2011  [DATE]
RESULT:     The lung fields are clear. No pneumonia, pneumothorax or pleural
effusion is seen. Heart size is upper limits for normal. There is arthritic
change at both shoulders.

[portable]
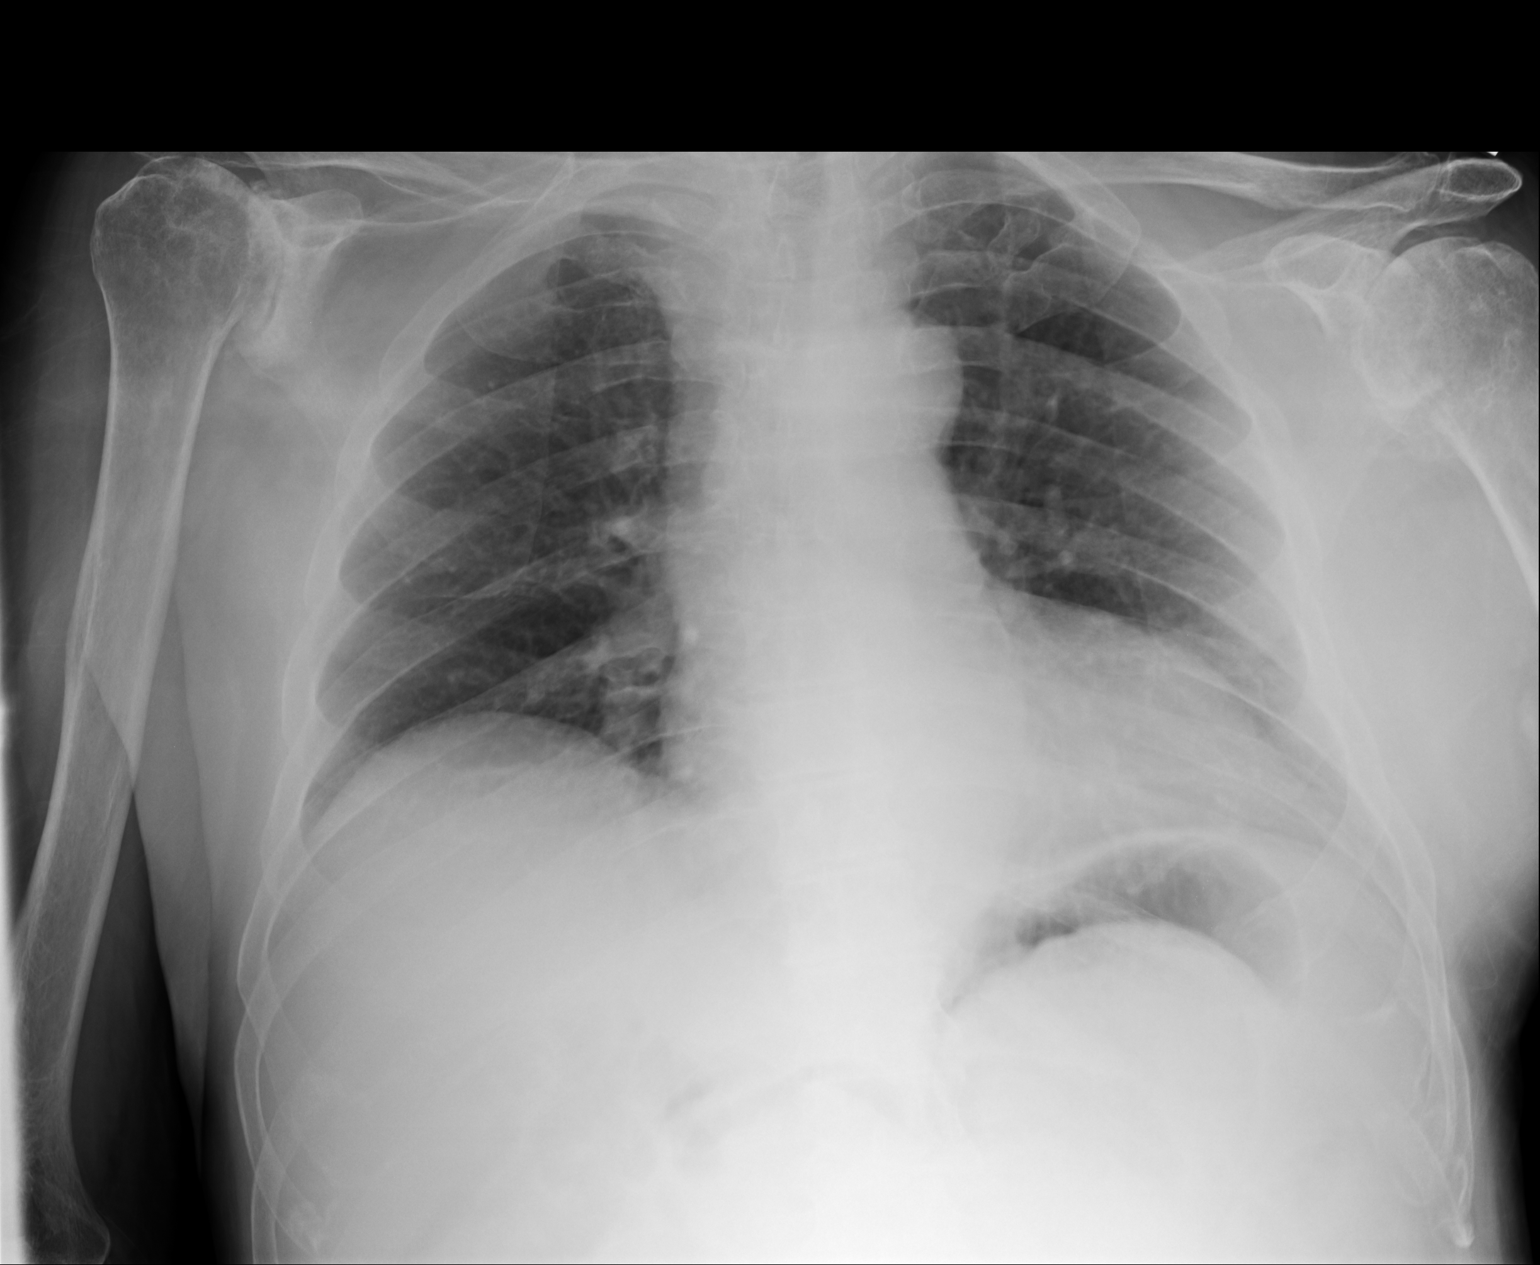

[1 of 1 positions shown; findings below may reference images not displayed]

IMPRESSION: 1. The lung fields are clear of infiltrate.
2. No pleural effusion or pulmonary edema is seen.
3. Heart size is upper limits for normal.
4. Arthritic change is noted at both shoulders.

## 2012-02-02 IMAGING — CT CT HEAD WITHOUT CONTRAST
3 of 4 series · 17 of 30 positions shown, 19 images · non-contrast
Comparison: none

REASON FOR EXAM: ams, head trauma
COMMENTS:   May transport without cardiac monitor

[Series 4: without · axial · non-contrast · 0.46mm/px · z∈[+460,+565]mm · 8 of 29 slices shown, 10 images (1 of 2)]
[im 4/29  brain]
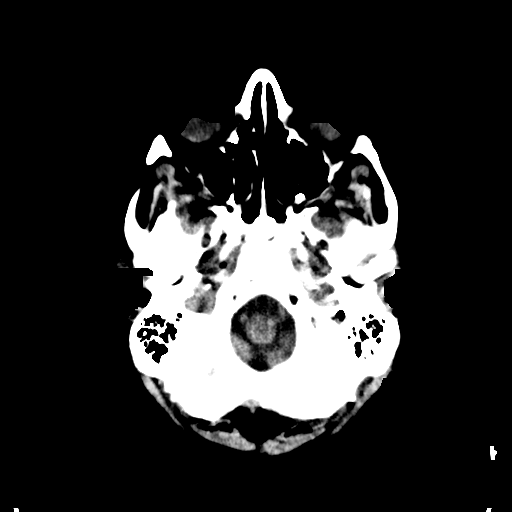
[im 4/29  bone]
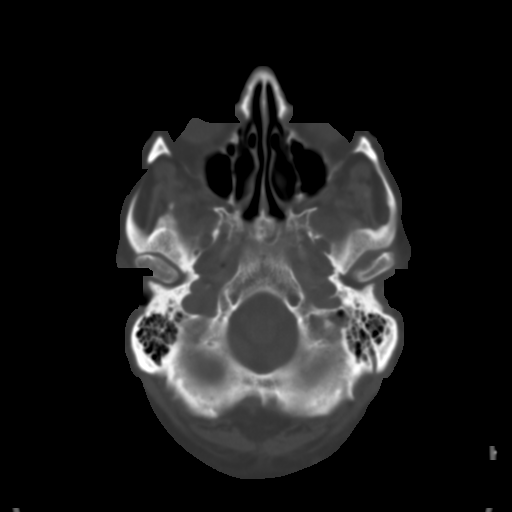
[im 7/29  brain]
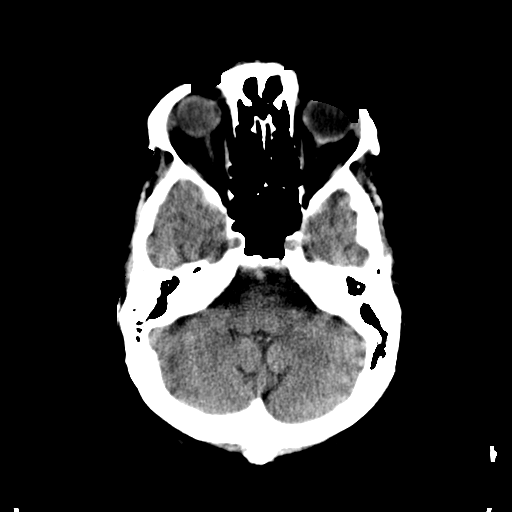
[im 10/29  brain]
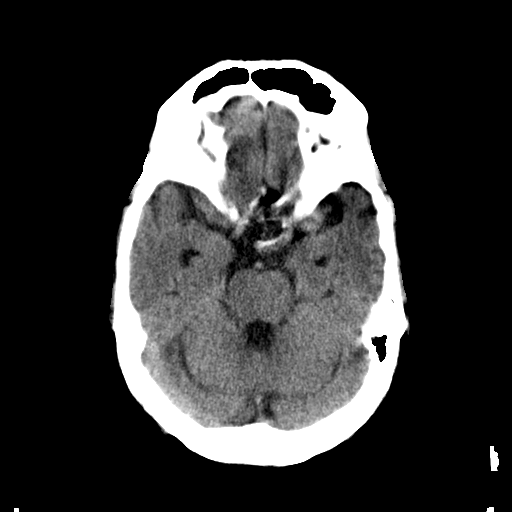
[im 13/29  brain]
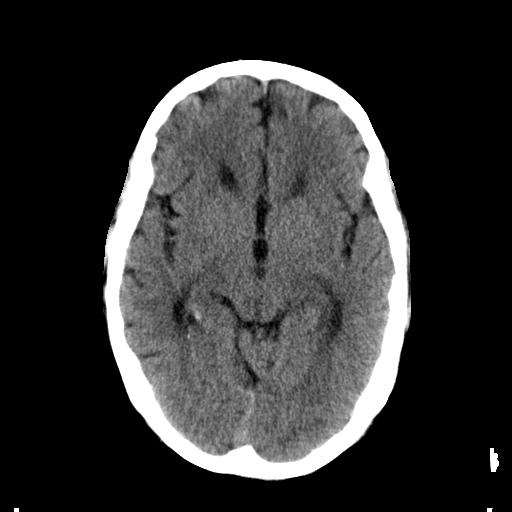
[im 16/29  brain]
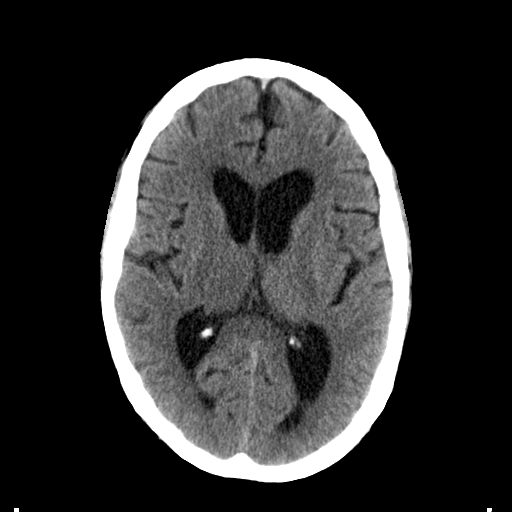
[im 16/29  bone]
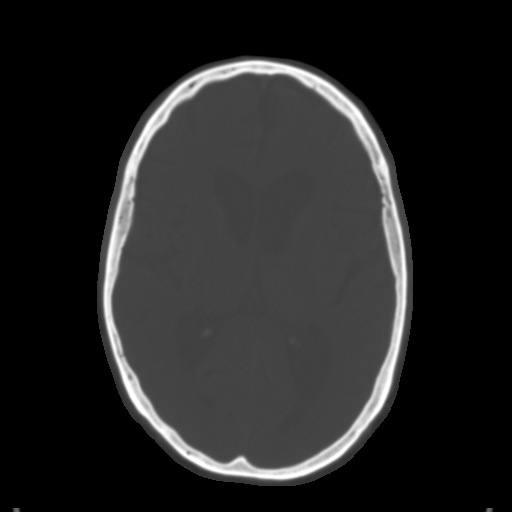
[im 19/29  brain]
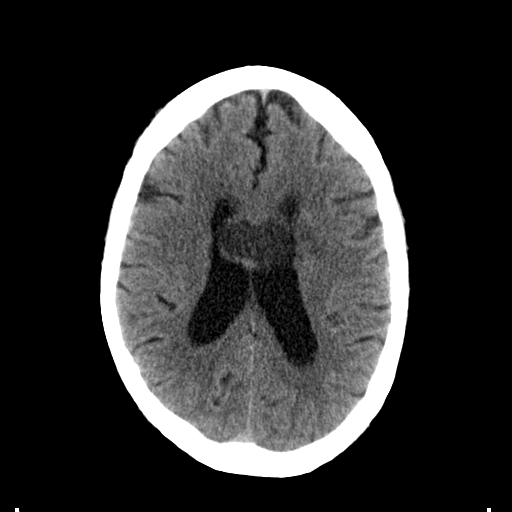
[im 22/29  brain]
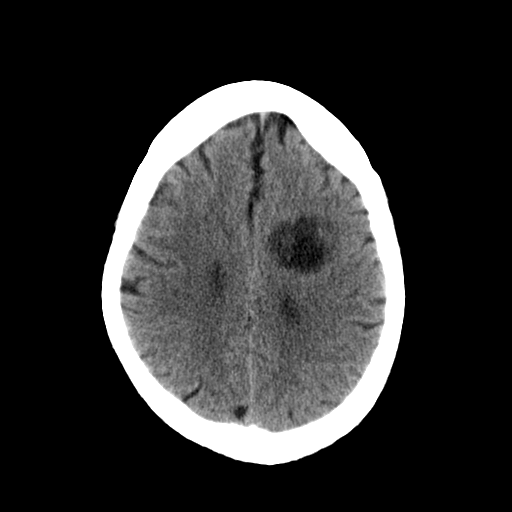
[im 25/29  brain]
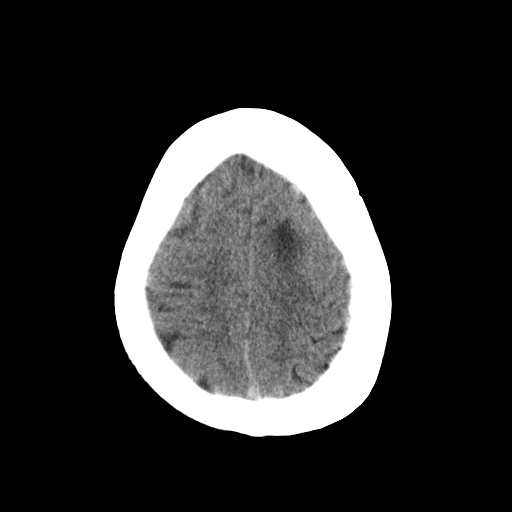

[Series 5: bone · axial · 0.46mm/px · z∈[+460,+535]mm · 6 of 28 slices shown]
[im 4/28  bone]
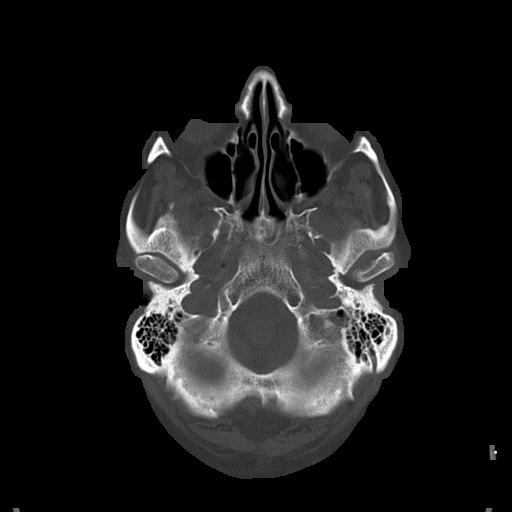
[im 7/28  bone]
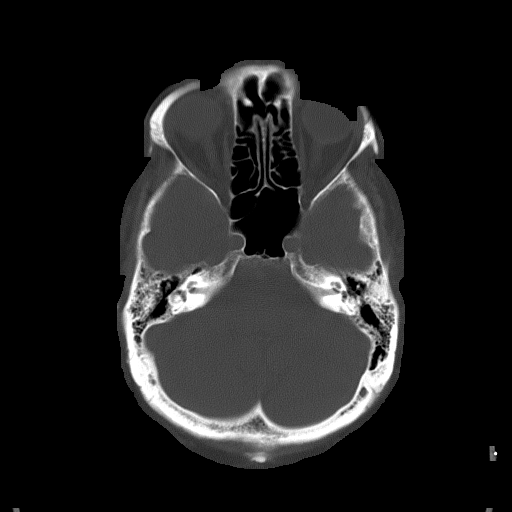
[im 10/28  bone]
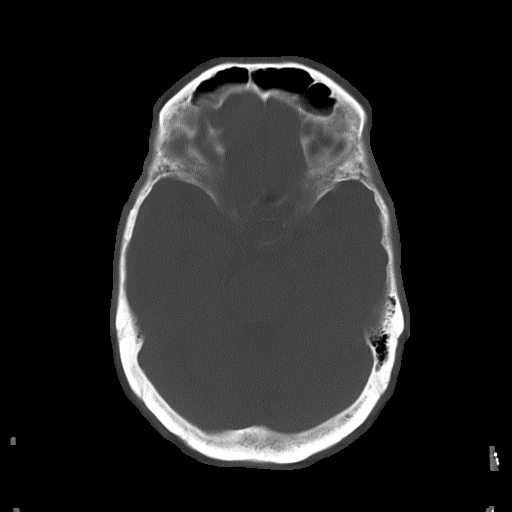
[im 13/28  bone]
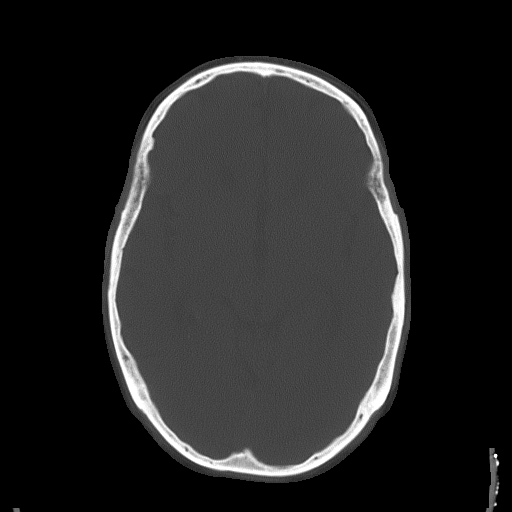
[im 16/28  bone]
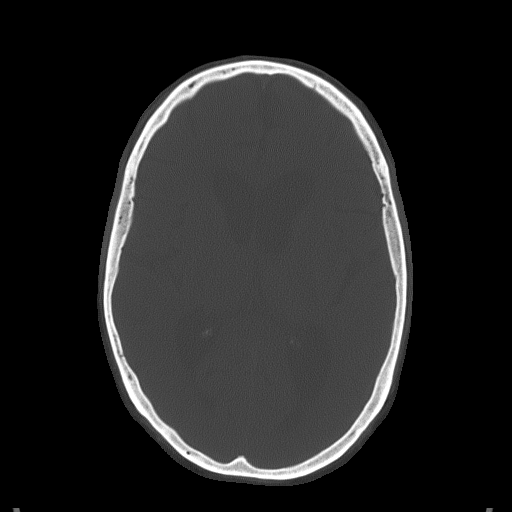
[im 19/28  bone]
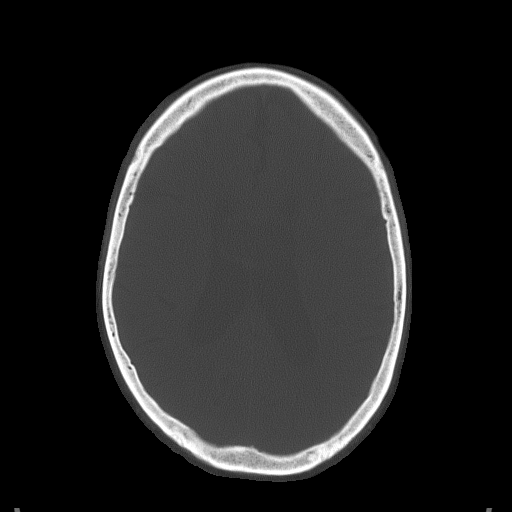

[Series 6: without · axial · non-contrast · 0.46mm/px · z∈[+550,+584]mm · 3 of 15 slices shown (2 of 2)]
[im 4/15  brain]
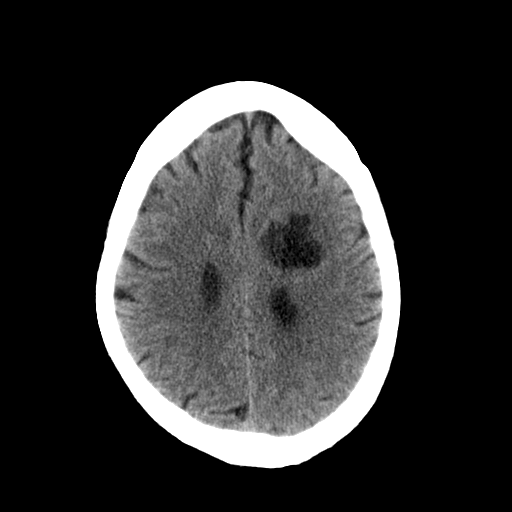
[im 8/15  brain]
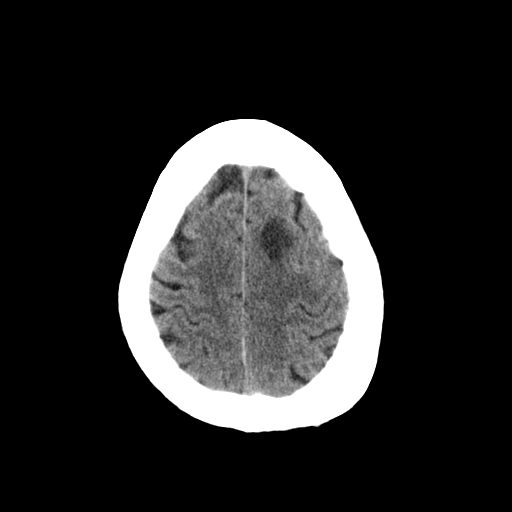
[im 11/15  brain]
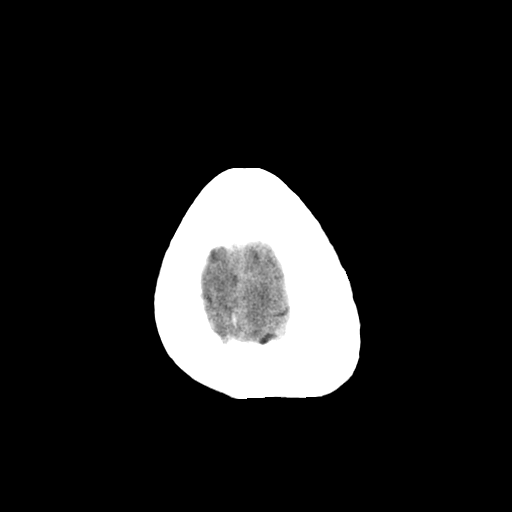

[17 of 30 positions shown; findings below may reference images not displayed]

PROCEDURE:     CT  - CT HEAD WITHOUT CONTRAST  - May 12, 2011  [DATE]

RESULT:     Axial noncontrast CT scanning was performed through the brain
with reconstructions at 5 mm intervals and slice thicknesses. There are no
previous studies for comparison.

There is an abnormal appearance of the anterior aspect of the corpus
callosum and the adjacent deep white matter of the left frontal lobe. A
noncalcified, nonhemorrhagic mass of heterogeneous mixed density is present.
This mass measures approximately 3.8 cm transversely by 2.8 cm AP. There is
mild diffuse cerebral and cerebellar atrophy with compensatory
ventriculomegaly. There is no acute intracranial hemorrhage. I do not see
evidence of a classic acute ischemic infarction.

At bone window settings I see no evidence of an acute skull fracture. There
may be pagetoid change in the left posterior parietal region. The paranasal
sinuses exhibit no air-fluid levels.
IMPRESSION: 1. I do not see evidence of an acute intracranial hemorrhage or other acute
posttraumatic abnormality.
2. There is an abnormal mass involving the corpus callosum in the midline
anteriorly extending into the deep white matter of the frontal lobe on the
left. The findings are worrisome for malignancy. Differential entities
include glioblastoma multiformae, lymphoma, and tumefactive multiple
sclerosis. Metastatic disease is felt to be less likely.
2. I see no air-fluid levels in the visualized portions of the paranasal
sinuses. There is expansile change of the posterior parietal bone the left
which may reflect underlying Paget's disease.

This report was called by me directly to Dr. Tatis at [DATE] p.m. on 11 May, 2011.

## 2013-03-08 ENCOUNTER — Encounter: Payer: Self-pay | Admitting: Cardiology

## 2013-08-01 ENCOUNTER — Telehealth: Payer: Self-pay | Admitting: *Deleted

## 2013-08-15 NOTE — Telephone Encounter (Signed)
Opened in error
